# Patient Record
Sex: Male | Born: 1985 | Race: White | Hispanic: No | Smoking: Never smoker
Health system: Southern US, Community
[De-identification: ages and names within clinical notes are randomized; demographics above are authoritative.]

## PROBLEM LIST (undated history)

## (undated) DIAGNOSIS — T7840XA Allergy, unspecified, initial encounter: Secondary | ICD-10-CM

## (undated) DIAGNOSIS — R569 Unspecified convulsions: Secondary | ICD-10-CM

## (undated) HISTORY — DX: Allergy, unspecified, initial encounter: T78.40XA

## (undated) HISTORY — DX: Unspecified convulsions: R56.9

---

## 2011-08-23 DIAGNOSIS — G40909 Epilepsy, unspecified, not intractable, without status epilepticus: Secondary | ICD-10-CM | POA: Insufficient documentation

## 2012-04-19 DIAGNOSIS — G4733 Obstructive sleep apnea (adult) (pediatric): Secondary | ICD-10-CM | POA: Insufficient documentation

## 2012-04-19 DIAGNOSIS — G479 Sleep disorder, unspecified: Secondary | ICD-10-CM | POA: Insufficient documentation

## 2015-10-06 ENCOUNTER — Ambulatory Visit (INDEPENDENT_AMBULATORY_CARE_PROVIDER_SITE_OTHER): Payer: BLUE CROSS/BLUE SHIELD

## 2015-10-06 ENCOUNTER — Ambulatory Visit (INDEPENDENT_AMBULATORY_CARE_PROVIDER_SITE_OTHER): Payer: BLUE CROSS/BLUE SHIELD | Admitting: Family Medicine

## 2015-10-06 VITALS — BP 116/70 | HR 80 | Temp 98.1°F | Resp 18 | Ht 69.0 in | Wt 211.0 lb

## 2015-10-06 DIAGNOSIS — S62339A Displaced fracture of neck of unspecified metacarpal bone, initial encounter for closed fracture: Secondary | ICD-10-CM | POA: Diagnosis not present

## 2015-10-06 DIAGNOSIS — S6992XA Unspecified injury of left wrist, hand and finger(s), initial encounter: Secondary | ICD-10-CM

## 2015-10-06 NOTE — Progress Notes (Addendum)
Subjective:  By signing my name below, I, Stann Ore, attest that this documentation has been prepared under the direction and in the presence of Norberto Sorenson, MD. Electronically Signed: Stann Ore, Scribe. 10/06/2015 , 2:23 PM .  Patient was seen in Room 10 .   Patient ID: Lucas Anderson, male    DOB: 10/26/1985, 30 y.o.   MRN: [id number] Chief Complaint  Patient presents with  . Hand Injury    LEFT   HPI Lucas Anderson is a 30 y.o. male who presents to [facility] complaining of left hand pain due to an injury 2 evenings ago. 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 An opponent was dribbling the ball and he slid down to block with his hands. At the same time, a teammate defender slid forward to block and slammed his cleat into the patient's left hand. He was wearing goalie gloves at the time of injury. He has been icing the area, last ice applied yesterday around 12:00PM. He notes the swelling is about the same as yesterday night. He denies any prior injuries to the hand. He denies numbness or tingling pain.   He's right hand dominant.   Past Medical History  Diagnosis Date  . Allergy   . Seizures (HCC)    Prior to Admission medications   Medication Sig Start Date End Date Taking? Authorizing Provider  lamoTRIgine (LAMICTAL) 100 MG tablet Take 100 mg by mouth daily.   Yes Historical Provider, MD  levETIRAcetam (KEPPRA) 250 MG tablet Take 1,000 mg by mouth 2 (two) times daily.  IN THE MORNING AND  AT NIGHT   Yes Historical Provider, MD   Allergies  Allergen Reactions  . Codeine Nausea And Vomiting    Review of Systems  Constitutional: Negative for fever, chills and fatigue.  Musculoskeletal: Positive for myalgias, joint swelling and arthralgias. Negative for neck pain and neck stiffness.  Skin: Negative for rash and wound.  Neurological: Negative for dizziness, weakness, numbness and headaches.       Objective:   Physical Exam  Constitutional: He  is oriented to person, place, and time. He appears well-developed and well-nourished. No distress.  HENT:  Head: Normocephalic and atraumatic.  Eyes: EOM are normal. Pupils are equal, round, and reactive to light.  Neck: Neck supple.  Cardiovascular: Normal rate.   Pulses:      Radial pulses are 2+ on the right side, and 2+ on the left side.  Pulmonary/Chest: Effort normal. No respiratory distress.  Musculoskeletal: Normal range of motion.  Diffuse swelling over the dorsum of his left hand, bruising just proximal to the 4th and 5th MCP, good movement of IP joints and MCP joints, minimal tenderness to palpation along 5th metacarpal, pallor 3rd to 5th fingers, cool to touch, cap refill <1 second  Neurological: He is alert and oriented to person, place, and time.  Skin: Skin is warm and dry.  Psychiatric: He has a normal mood and affect. His behavior is normal.  Nursing note and vitals reviewed.   BP 116/70 mmHg  Pulse 80  Temp(Src) 98.1 F (36.7 C) (Oral)  Resp 18  Ht  (1.753 m)  Wt 211 lb (95.709 kg)  BMI 31.15 kg/m2  SpO2 99%   Dg Hand Complete Left  10/06/2015  CLINICAL DATA:  Injury to the hand from the cleavage while playing soccer since Sunday. EXAM: LEFT HAND - COMPLETE 3+ VIEW COMPARISON:  None. FINDINGS: There is displaced fracture of the distal fourth metacarpal. There  is no dislocation. IMPRESSION: Fracture fourth metacarpal. Electronically Signed   By: Sherian ReinWei-Chen  Lin M.D.   On: 10/06/2015 14:42      Assessment & Plan:  Placed in ulnar gutter splint, follow up 7-10 days for repeat xray to ensure has maintained alignment. Pt requests referral to Dr. Amanda PeaGramig to follow this - placed and copy of xray given to pt. Advised to ice and elevate.  RICE, placed in sling to help pt keep it elevated. otc nsaids/tylenol for pain. Will plan to keep in splint for 3-4 weeks, repeat xray at follow up visit and every 2 weeks - disc given to pt. Will take 4-6 weeks to heal. And should begin  ROM exercises, such as grip strengthening immediately after immobilization is complete. No contact sports for 6 weeks.   1. Hand injury, left, initial encounter   2. Fracture, metacarpal, neck, closed, initial encounter     Orders Placed This Encounter  Procedures  . DG Hand Complete Left    Standing Status: Future     Number of Occurrences: 1     Standing Expiration Date: 10/05/2016    Order Specific Question:  Reason for Exam (SYMPTOM  OR DIAGNOSIS REQUIRED)    Answer:  injury while soccer - cleat onto gloved hand    Order Specific Question:  Preferred imaging location?    Answer:  External    Meds ordered this encounter  Medications  . lamoTRIgine (LAMICTAL) 100 MG tablet    Sig: Take 100 mg by mouth daily.  Marland Kitchen. levETIRAcetam (KEPPRA) 250 MG tablet    Sig: Take 1,000 mg by mouth 2 (two) times daily. 1000MG  IN THE MORNING AND 1500MG  AT NIGHT    I personally performed the services described in this documentation, which was scribed in my presence. The recorded information has been reviewed and considered, and addended by me as needed.  Norberto SorensonEva Shaw, MD MPH

## 2015-10-06 NOTE — Progress Notes (Signed)
Ulnar gutter splint applied to the left hand/forearm per Dr. Alver FisherShaw's VO.  Charges dropped in Epic. Deliah BostonMichael Shada Nienaber, MS, PA-C 3:22 PM, 10/06/2015

## 2015-10-06 NOTE — Patient Instructions (Addendum)
IF you received an x-ray today, you will receive an invoice from [facility]. Please contact [facility] at [phone] with questions or concerns regarding your invoice.   IF you received labwork today, you will receive an invoice from [facility]. Please contact [facility] at [phone] with questions or concerns regarding your invoice.   Our billing staff will not be able to assist you with questions regarding bills from these companies.  You will be contacted with the lab results as soon as they are available. The fastest way to get your results is to activate your My Chart account. Instructions are located on the last page of this paperwork. If you have not heard from us regarding the results in 2 weeks, please contact this office.    Metacarpal Fracture A metacarpal fracture is a break (fracture) of a bone in the hand. Metacarpals are the bones that extend from your knuckles to your wrist. In each hand, you have five metacarpal bones that connect your fingers and your thumb to your wrist. Some hand fractures have bone pieces that are close together and stable (simple). These fractures may be treated with only a splint or cast. Hand fractures that have many pieces of broken bone (comminuted), unstable bone pieces (displaced), or a bone that breaks through the skin (compound) usually require surgery. CAUSES This injury may be caused by:  A fall.  A hard, direct hit to your hand.  An injury that squeezes your knuckle, stretches your finger out of place, or crushes your hand. RISK FACTORS This injury is more likely to occur if:  You play contact sports.  You have certain bone diseases. SYMPTOMS  Symptoms of this type of fracture develop soon after the injury. Symptoms may include:  Swelling.  Pain.  Stiffness.  Increased pain with movement.  Bruising.  Inability to move a finger.  A shortened finger.  A finger knuckle  that looks sunken in.  Unusual appearance of the hand or finger (deformity). DIAGNOSIS  This injury may be diagnosed based on your signs and symptoms, especially if you had a recent hand injury. Your health care provider will perform a physical exam. He or she may also order X-rays to confirm the diagnosis.  TREATMENT  Treatment for this injury depends on the type of fracture you have and how severe it is. Possible treatments include:  Non-reduction. This can be done if the bone does not need to be moved back into place. The fracture can be casted or splinted as it is.   Closed reduction. If your bone is stable and can be moved back into place, you may only need to wear a cast or splint or have buddy taping.  Closed reduction with internal fixation (CRIF). This is the most common treatment. You may have this procedure if your bone can be moved back into place but needs more support. Wires, pins, or screws may be inserted through your skin to stabilize the fracture.  Open reduction with internal fixation (ORIF). This may be needed if your fracture is severe and unstable. It involves surgery to move your bone back into the right position. Screws, wires, or plates are used to stabilize the fracture. After all procedures, you may need to wear a cast or a splint for several weeks. You will also need to have follow-up X-rays to make sure that the bone is healing well and staying in position. After you no longer need your cast or splint, you may  need physical therapy. This will help you to regain full movement and strength in your hand.  HOME CARE INSTRUCTIONS  If You Have a Cast:  Do not stick anything inside the cast to scratch your skin. Doing that increases your risk of infection.  Check the skin around the cast every day. Report any concerns to your health care provider. You may put lotion on dry skin around the edges of the cast. Do not apply lotion to the skin underneath the cast. If You Have  a Splint:  Wear it as directed by your health care provider. Remove it only as directed by your health care provider.  Loosen the splint if your fingers become numb and tingle, or if they turn cold and blue. Bathing  Cover the cast or splint with a watertight plastic bag to protect it from water while you take a bath or a shower. Do not let the cast or splint get wet. Managing Pain, Stiffness, and Swelling  If directed, apply ice to the injured area (if you have a splint, not a cast):  Put ice in a plastic bag.  Place a towel between your skin and the bag.  Leave the ice on for 20 minutes, 2-3 times a day.  Move your fingers often to avoid stiffness and to lessen swelling.  Raise the injured area above the level of your heart while you are sitting or lying down. Driving  Do not drive or operate heavy machinery while taking pain medicine.  Do not drive while wearing a cast or splint on a hand that you use for driving. Activity  Return to your normal activities as directed by your health care provider. Ask your health care provider what activities are safe for you. General Instructions  Do not put pressure on any part of the cast or splint until it is fully hardened. This may take several hours.  Keep the cast or splint clean and dry.  Do not use any tobacco products, including cigarettes, chewing tobacco, or electronic cigarettes. Tobacco can delay bone healing. If you need help quitting, ask your health care provider.  Take medicines only as directed by your health care provider.  Keep all follow-up visits as directed by your health care provider. This is important. SEEK MEDICAL CARE IF:   Your pain is getting worse.  You have redness, swelling, or pain in the injured area.   You have fluid, blood, or pus coming from under your cast or splint.   You notice a bad smell coming from under your cast or splint.   You have a fever.  SEEK IMMEDIATE MEDICAL CARE IF:    You develop a rash.   You have trouble breathing.   Your skin or nails on your injured hand turn blue or gray even after you loosen your splint.  Your injured hand feels cold or becomes numb even after you loosen your splint.   You develop severe pain under the cast or in your hand.   This information is not intended to replace advice given to you by your health care provider. Make sure you discuss any questions you have with your health care provider.   Document Released: 05/23/2005 Document Revised: 02/11/2015 Document Reviewed: 03/12/2014 Elsevier Interactive Patient Education [employer]

## 2016-11-23 DIAGNOSIS — J301 Allergic rhinitis due to pollen: Secondary | ICD-10-CM | POA: Insufficient documentation

## 2016-11-23 DIAGNOSIS — L219 Seborrheic dermatitis, unspecified: Secondary | ICD-10-CM | POA: Insufficient documentation

## 2017-08-13 IMAGING — CR DG HAND COMPLETE 3+V*L*
3 series · 3 of 3 positions shown · non-contrast
Comparison: None.

CLINICAL DATA: Injury to the hand from the cleavage while playing
soccer since [REDACTED].

EXAM:
LEFT HAND - COMPLETE 3+ VIEW

[PA]
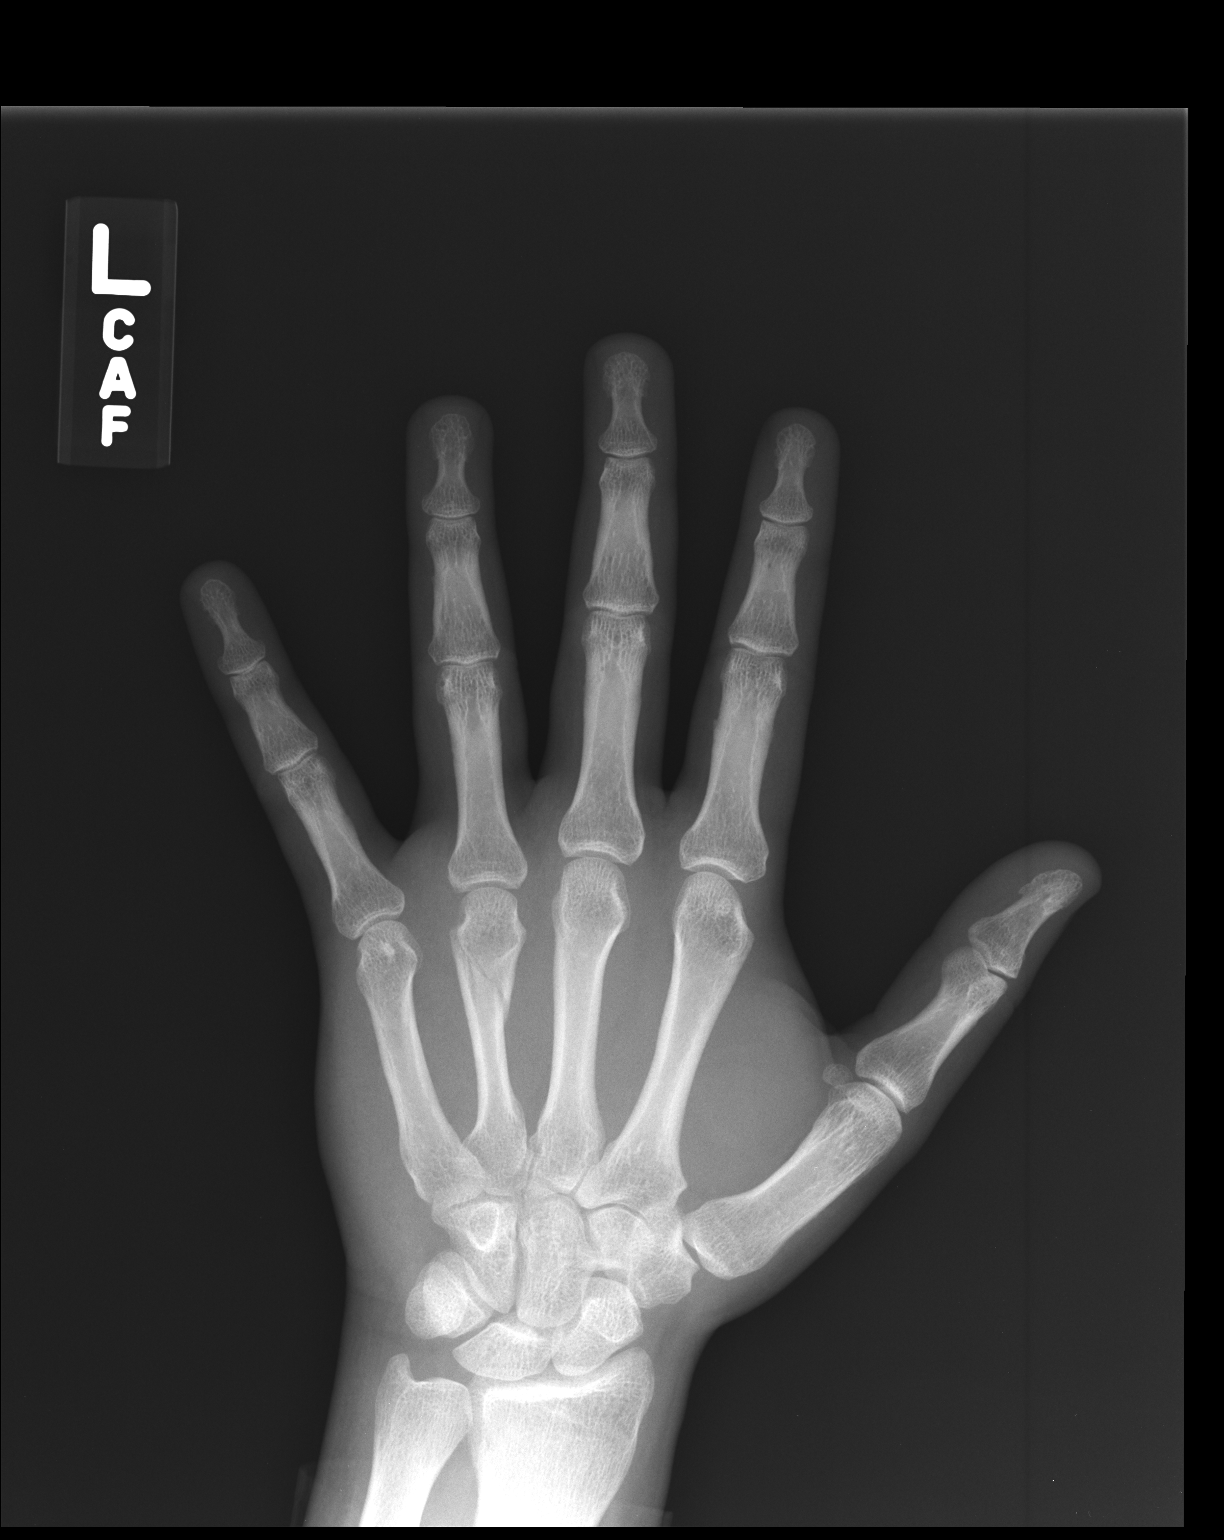

[lateral]
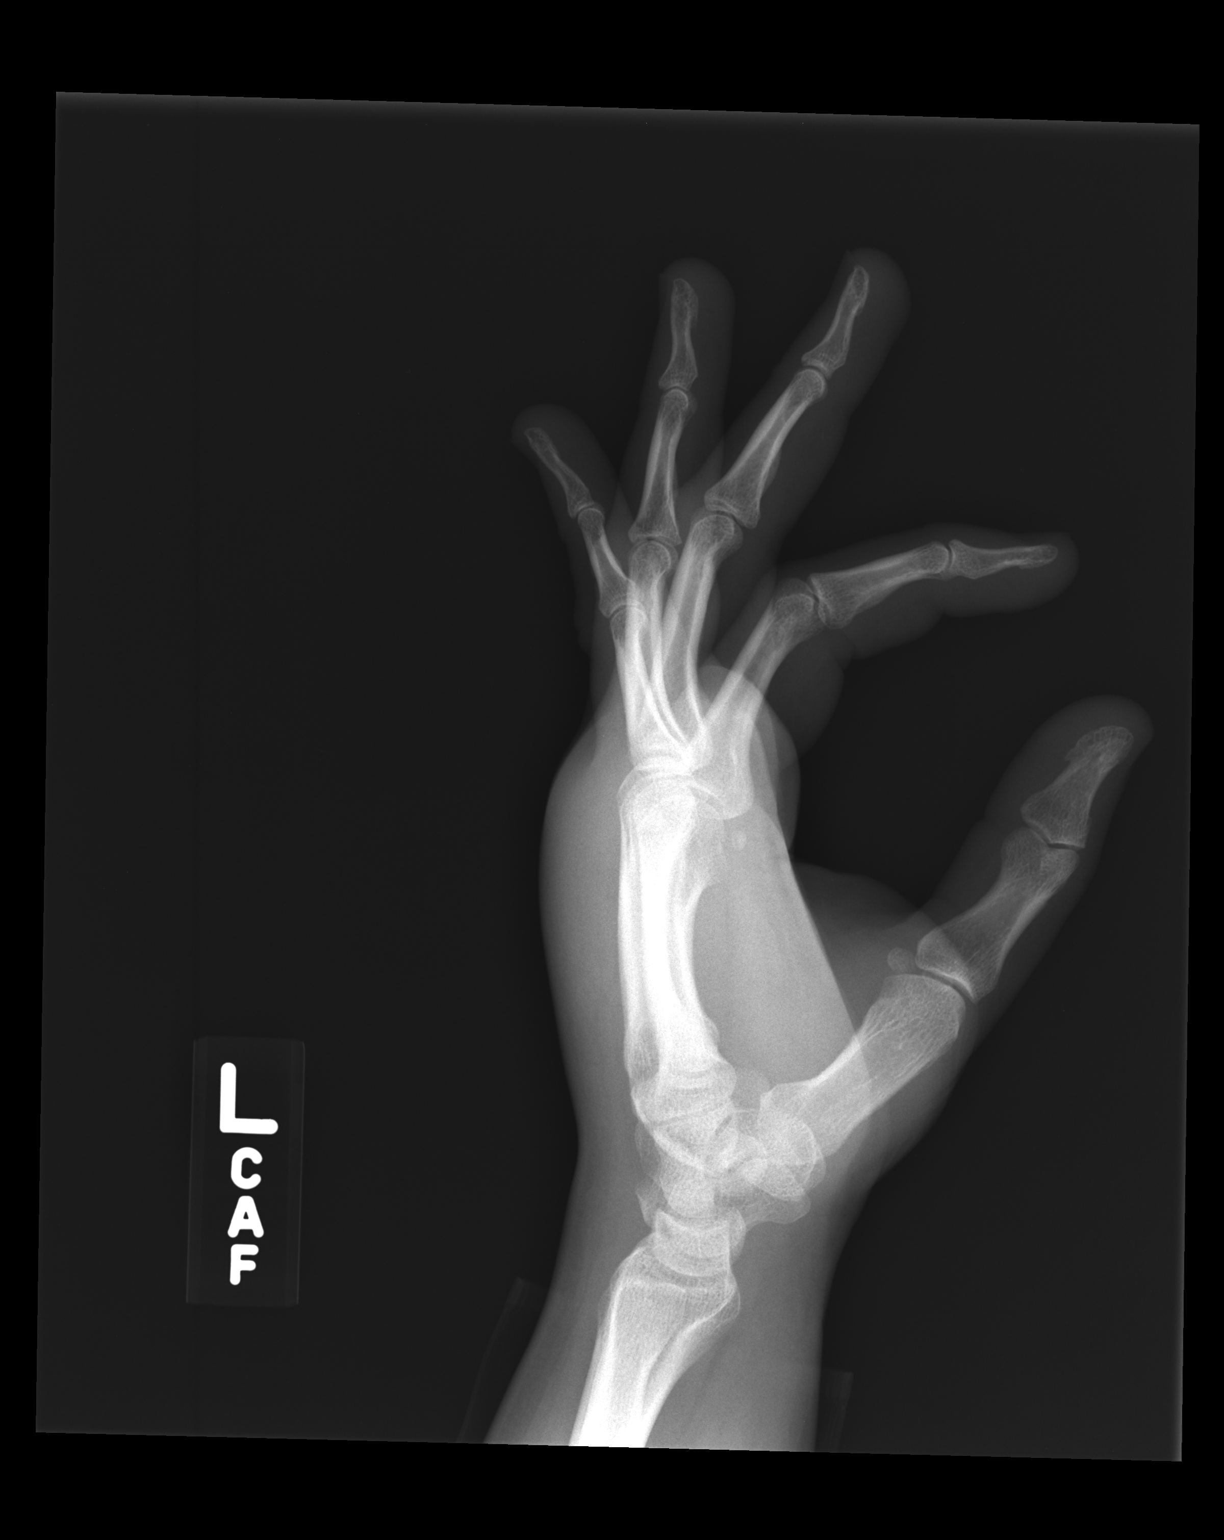

[pa obl]
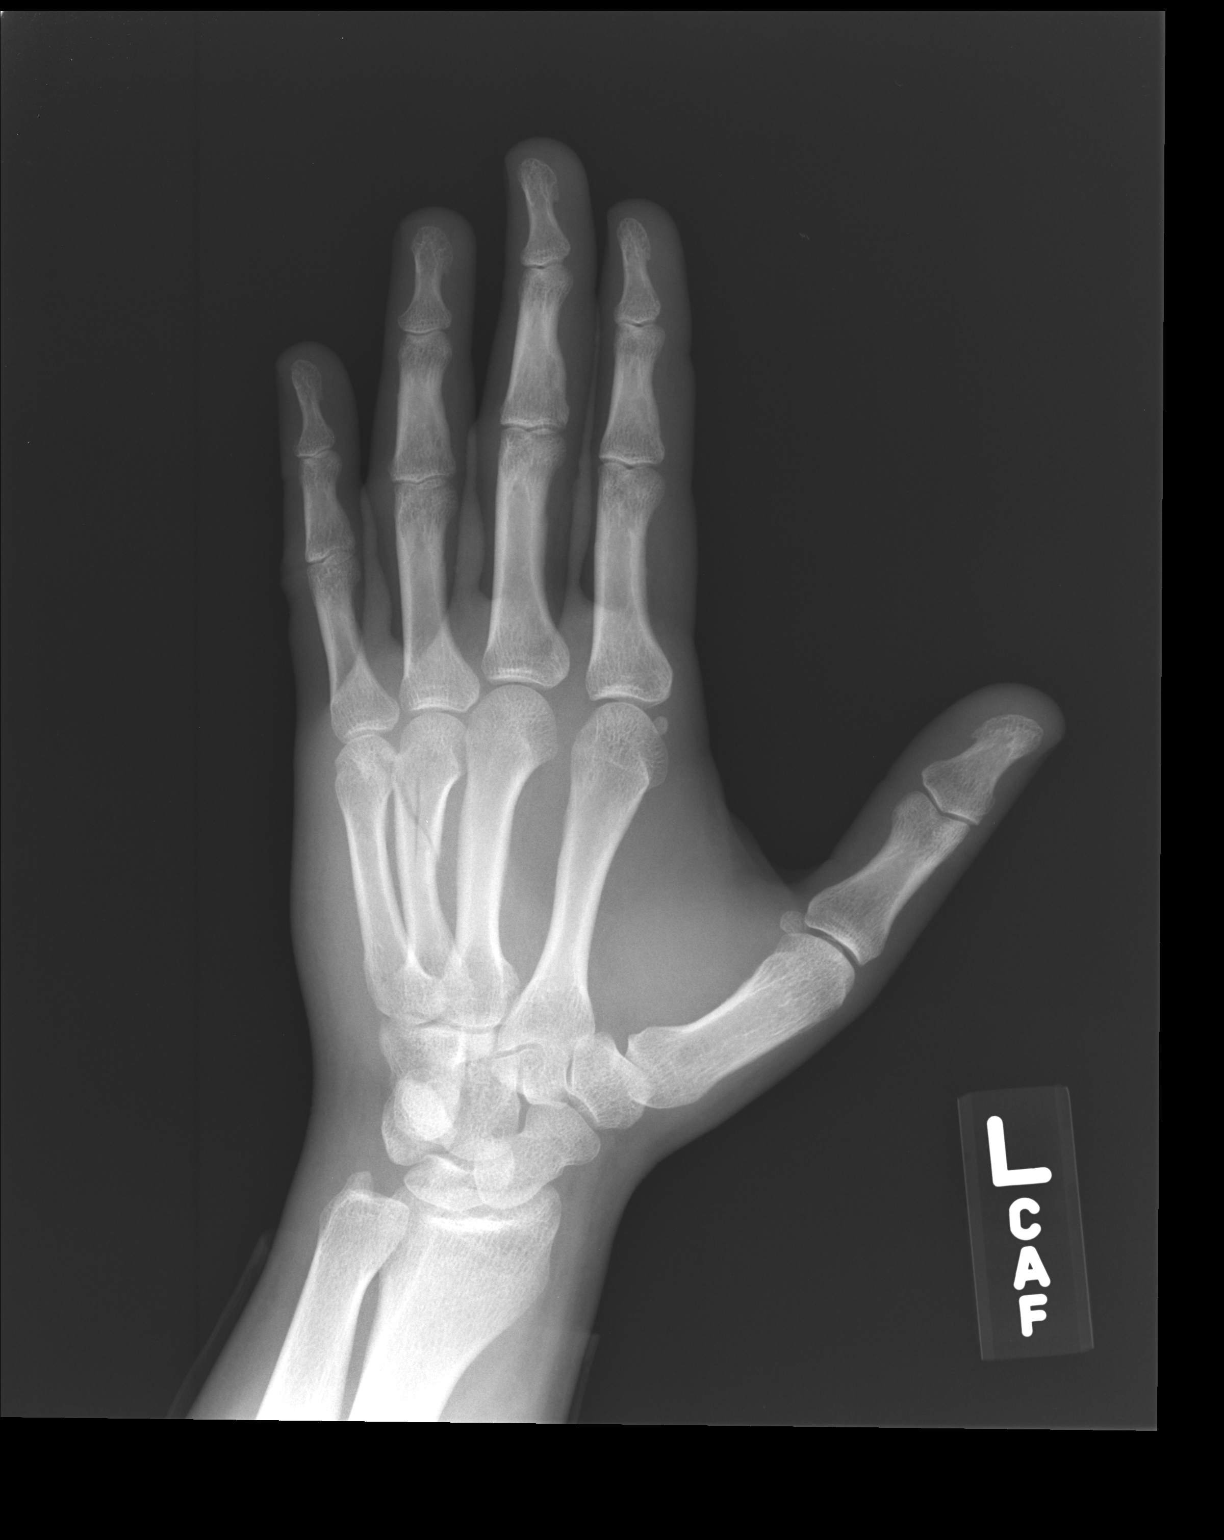

[3 of 3 positions shown; findings below may reference images not displayed]

FINDINGS: There is displaced fracture of the distal fourth metacarpal. There
is no dislocation.
IMPRESSION: Fracture fourth metacarpal.

## 2017-10-05 ENCOUNTER — Encounter: Payer: Self-pay | Admitting: Neurology

## 2017-12-19 ENCOUNTER — Other Ambulatory Visit: Payer: Self-pay

## 2017-12-27 ENCOUNTER — Other Ambulatory Visit: Payer: Self-pay

## 2017-12-27 ENCOUNTER — Encounter: Payer: Self-pay | Admitting: Neurology

## 2017-12-27 ENCOUNTER — Ambulatory Visit: Payer: BC Managed Care – PPO | Admitting: Neurology

## 2017-12-27 ENCOUNTER — Other Ambulatory Visit (INDEPENDENT_AMBULATORY_CARE_PROVIDER_SITE_OTHER): Payer: BC Managed Care – PPO

## 2017-12-27 VITALS — BP 110/72 | HR 68 | Wt 213.0 lb

## 2017-12-27 DIAGNOSIS — G40319 Generalized idiopathic epilepsy and epileptic syndromes, intractable, without status epilepticus: Secondary | ICD-10-CM

## 2017-12-27 LAB — COMPREHENSIVE METABOLIC PANEL
ALT: 28 U/L (ref 0–53)
AST: 19 U/L (ref 0–37)
Albumin: 4.6 g/dL (ref 3.5–5.2)
Alkaline Phosphatase: 75 U/L (ref 39–117)
BUN: 19 mg/dL (ref 6–23)
CALCIUM: 9.6 mg/dL (ref 8.4–10.5)
CHLORIDE: 101 meq/L (ref 96–112)
CO2: 32 mEq/L (ref 19–32)
Creatinine, Ser: 0.99 mg/dL (ref 0.40–1.50)
GFR: 93.22 mL/min (ref >60.00)
Glucose, Bld: 91 mg/dL (ref 70–99)
POTASSIUM: 4.3 meq/L (ref 3.5–5.1)
SODIUM: 139 meq/L (ref 135–145)
Total Bilirubin: 0.5 mg/dL (ref 0.2–1.2)
Total Protein: 7.8 g/dL (ref 6.0–8.3)

## 2017-12-27 LAB — CBC
HEMATOCRIT: 47.1 % (ref 39.0–52.0)
Hemoglobin: 15.7 g/dL (ref 13.0–17.0)
MCHC: 33.4 g/dL (ref 30.0–36.0)
MCV: 89.3 fl (ref 78.0–100.0)
PLATELETS: 207 10*3/uL (ref 150.0–400.0)
RBC: 5.28 Mil/uL (ref 4.22–5.81)
RDW: 12.7 % (ref 11.5–15.5)
WBC: 6.5 10*3/uL (ref 4.0–10.5)

## 2017-12-27 MED ORDER — LAMOTRIGINE 200 MG PO TABS: ORAL_TABLET | ORAL | 11 refills | Status: DC

## 2017-12-27 MED ORDER — LEVETIRACETAM 500 MG PO TABS: ORAL_TABLET | ORAL | 11 refills | Status: DC

## 2017-12-27 MED ORDER — DIVALPROEX SODIUM ER 250 MG PO TB24: ORAL_TABLET | ORAL | 11 refills | Status: DC

## 2017-12-27 NOTE — Patient Instructions (Signed)
1. Bloodwork for CBC, CMP 2. Start Depakote ER 250mg  every night 3. Continue Keppra 500mg : take 3 tabs twice a day, 4. Switch to Lamictal 200mg : take 2 tablets twice a day (same daily dose of 400mg  twice a day) 5. Keep a calendar of your seizures, follow-up in 6 months, call for any changes  Seizure Precautions: 1. If medication has been prescribed for you to prevent seizures, take it exactly as directed.  Do not stop taking the medicine without talking to your doctor first, even if you have not had a seizure in a long time.   2. Avoid activities in which a seizure would cause danger to yourself or to others.  Don't operate dangerous machinery, swim alone, or climb in high or dangerous places, such as on ladders, roofs, or girders.  Do not drive unless your doctor says you may.  3. If you have any warning that you may have a seizure, lay down in a safe place where you can't hurt yourself.    4.  No driving for 6 months from last seizure, as per [facility].   Please refer to the following link on the Epilepsy Foundation of America's website for more information: http://www.epilepsyfoundation.org/answerplace/Social/driving/drivingu.cfm   5.  Maintain good sleep hygiene. Avoid alcohol.  6.  Contact your doctor if you have any problems that may be related to the medicine you are taking.  7.  Call 911 and bring the patient back to the ED if:        A.  The seizure lasts longer than 5 minutes.       B.  The patient doesn't awaken shortly after the seizure  C.  The patient has new problems such as difficulty seeing, speaking or moving  D.  The patient was injured during the seizure  E.  The patient has a temperature over 102 F (39C)  F.  The patient vomited and now is having trouble breathing

## 2017-12-27 NOTE — Progress Notes (Signed)
NEUROLOGY CONSULTATION NOTE  Lucas Anderson MRN: [id number] DOB: January 12, 1986  Referring provider: Jose Persia, PA-C Primary care provider: none listed  Reason for consult:  Establish care for seizures  Thank you for your kind referral of Lucas Anderson for consultation of the above symptoms. Although his history is well known to you, please allow me to reiterate it for the purpose of our medical record. He is alone in the office today. Records and images were personally reviewed where available.  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 Seizures started in 2009, he has an average of 3-4 seizures a year, provoked by sleep deprivation and missed medication. Longest seizure-free interval was close to a year. He denies any clear warning, but one time at work last March 2019 he felt fuzzy then woke up on the floor with slight tongue bite. Family has reported he would stare and become unresponsive, then vocalize and fall to the ground, with convulsions lasting a few minutes. MRI brain in [place] reported as normal. EEG in January 2012 reported bilaterally synchronous bifrontally predominant spike and waves and polyspikes of 3-4 Hz frequency. He had irritability on higher doses of Keppra, currently on Keppra 1500mg  BID. He has been on escalating doses of Lamotrigine, last dose change in September 2018 to 400mg  BID for seizure in August 2018. He reports an increase in frequency of seizures, he had 6 seizures in 2018, then reports a seizure at work in January 2019 and March or April 2019. He missed his medication in January but there was no clear trigger for last seizure in March/April 2019. He denies any clear myoclonic jerks, he notices occasional trembling of 2 fingers on his right hand during the day. He denies any gaps in time, olfactory/gustatory hallucinations, deja vu, rising epigastric sensation,  focal numbness/tingling/weakness. He usually has a headache lasting 5-6 hours after a seizure and sleeps. He denies any significant regular headaches, dizziness, diplopia, dysarthria/dysphagia, neck pain, bowel/bladder dysfunction. He has occasional low back pain. Memory is good, he has noticed memory changes with his seizure medications, he usually writes things down. He lives with his 49 year old son. He does not drive.   Epilepsy Risk Factors:  His 2 paternal uncles had seizures. Otherwise he had a normal birth and early development.  There is no history of febrile convulsions, CNS infections such as meningitis/encephalitis, significant traumatic brain injury, neurosurgical procedures.   PAST MEDICAL HISTORY: Past Medical History:  Diagnosis Date  . Allergy   . Seizures (HCC)     PAST SURGICAL HISTORY: History reviewed. No pertinent surgical history.  MEDICATIONS: Current Outpatient Medications on File Prior to Visit  Medication Sig Dispense Refill  . lamoTRIgine (LAMICTAL) 100 MG tablet Take 100 mg by mouth daily (taking 400mg  BID)    . levETIRAcetam (KEPPRA) 500 MG tablet Take 3 tabs BID    . Multiple Vitamin (MULTIVITAMIN) tablet Take by mouth.     No current facility-administered medications on file prior to visit.     ALLERGIES: Allergies  Allergen Reactions  . Codeine Nausea And Vomiting  . Chlorpheniramine-Pseudoeph   . Diphenhydramine   . Doxylamine     FAMILY HISTORY: Family History  Problem Relation Age of Onset  . Cancer Mother   . Hypertension Mother   . Cancer Maternal Grandmother   . Hyperlipidemia Maternal Grandmother     SOCIAL HISTORY: Social History   Socioeconomic History  .  Marital status: Divorced    Spouse name: Not on file  . Number of children: Not on file  . Years of education: Not on file  . Highest education level: Not on file  Occupational History  . Not on file  Social Needs  . Financial resource strain: Not on file  . Food  insecurity:    Worry: Not on file    Inability: Not on file  . Transportation needs:    Medical: Not on file    Non-medical: Not on file  Tobacco Use  . Smoking status: Never Smoker  . Smokeless tobacco: Never Used  Substance and Sexual Activity  . Alcohol use: Not on file  . Drug use: Never  . Sexual activity: Not on file  Lifestyle  . Physical activity:    Days per week: Not on file    Minutes per session: Not on file  . Stress: Not on file  Relationships  . Social connections:    Talks on phone: Not on file    Gets together: Not on file    Attends religious service: Not on file    Active member of club or organization: Not on file    Attends meetings of clubs or organizations: Not on file    Relationship status: Not on file  . Intimate partner violence:    Fear of current or ex partner: Not on file    Emotionally abused: Not on file    Physically abused: Not on file    Forced sexual activity: Not on file  Other Topics Concern  . Not on file  Social History Narrative  . Not on file    REVIEW OF SYSTEMS: Constitutional: No fevers, chills, or sweats, no generalized fatigue, change in appetite Eyes: No visual changes, double vision, eye pain Ear, nose and throat: No hearing loss, ear pain, nasal congestion, sore throat Cardiovascular: No chest pain, palpitations Respiratory:  No shortness of breath at rest or with exertion, wheezes GastrointestinaI: No nausea, vomiting, diarrhea, abdominal pain, fecal incontinence Genitourinary:  No dysuria, urinary retention or frequency Musculoskeletal:  No neck pain,+ occl back pain Integumentary: No rash, pruritus, skin lesions Neurological: as above Psychiatric: No depression, insomnia, anxiety Endocrine: No palpitations, fatigue, diaphoresis, mood swings, change in appetite, change in weight, increased thirst Hematologic/Lymphatic:  No anemia, purpura, petechiae. Allergic/Immunologic: no itchy/runny eyes, nasal congestion,  recent allergic reactions, rashes  PHYSICAL EXAM: Vitals:   12/27/17 1405  BP: 110/72  Pulse: 68  SpO2: 98%   General: No acute distress Head:  Normocephalic/atraumatic Eyes: Fundoscopic exam shows bilateral sharp discs, no vessel changes, exudates, or hemorrhages Neck: supple, no paraspinal tenderness, full range of motion Back: No paraspinal tenderness Heart: regular rate and rhythm Lungs: Clear to auscultation bilaterally. Vascular: No carotid bruits. Skin/Extremities: No rash, no edema Neurological Exam: Mental status: alert and oriented to person, place, and time, no dysarthria or aphasia, Fund of knowledge is appropriate.  Recent and remote memory are intact. 3/3 delayed recall.  Attention and concentration are normal.    Able to name objects and repeat phrases. Cranial nerves: CN I: not tested CN II: pupils equal, round and reactive to light, visual fields intact, fundi unremarkable. CN III, IV, VI:  full range of motion, no nystagmus, no ptosis CN V: facial sensation intact CN VII: upper and lower face symmetric CN VIII: hearing intact to finger rub CN IX, X: gag intact, uvula midline CN XI: sternocleidomastoid and trapezius muscles intact CN XII: tongue midline Bulk &  Tone: normal, no fasciculations. Motor: 5/5 throughout with no pronator drift. Sensation: intact to light touch, cold, pin, vibration and joint position sense.  No extinction to double simultaneous stimulation.  Romberg test negative Deep Tendon Reflexes: +2 throughout, no ankle clonus Plantar responses: downgoing bilaterally Cerebellar: no incoordination on finger to nose, heel to shin. No dysdiadochokinesia Gait: narrow-based and steady, able to tandem walk adequately. Tremor: none  IMPRESSION: This is a pleasant 32 year old right-handed man with a history of primary generalized epilepsy presenting to establish care. Prior EEG showed generalized 3-4 Hz spike and polyspikes with frontal predominance. He  continues to have recurrent seizures on Keppra 1500mg  BID and Lamotrigine 400mg  BID. Increasing doses will likely cause side effects and may not provide additional seizure control. We discussed adding on low dose Depakote ER 250mg  qhs. Side effects discussed. CBC and CMP will be ordered for safety labs before adding on Depakote. [place] driving laws were discussed with the patient, and he knows to stop driving after a seizure, until 6 months seizure-free. He was advised to keep a calendar of his seizures and follow-up in 6 months. He knows to call for any changes.   Thank you for allowing me to participate in the care of this patient. Please do not hesitate to call for any questions or concerns.   Patrcia DollyKaren Aquino, M.D.  CC: Jose PersiaKelly Conner, PA-C

## 2018-04-27 ENCOUNTER — Telehealth: Payer: Self-pay | Admitting: Neurology

## 2018-04-27 NOTE — Telephone Encounter (Signed)
Patient lmom  and has moved and needs to transfer his Prescriptions to [facility] [address] [place]. Thanks [phone].

## 2018-04-27 NOTE — Telephone Encounter (Signed)
This has been pt's preferred pharmacy for some time.  Last Rx's were sent here.  No changes made.

## 2018-06-07 ENCOUNTER — Encounter: Payer: Self-pay | Admitting: Neurology

## 2018-06-07 ENCOUNTER — Other Ambulatory Visit: Payer: Self-pay

## 2018-06-07 ENCOUNTER — Ambulatory Visit: Payer: BC Managed Care – PPO | Admitting: Neurology

## 2018-06-07 VITALS — BP 112/68 | HR 76 | Ht 70.0 in | Wt 218.0 lb

## 2018-06-07 DIAGNOSIS — G40319 Generalized idiopathic epilepsy and epileptic syndromes, intractable, without status epilepticus: Secondary | ICD-10-CM

## 2018-06-07 MED ORDER — LEVETIRACETAM 500 MG PO TABS: ORAL_TABLET | ORAL | 3 refills | Status: DC

## 2018-06-07 MED ORDER — DIVALPROEX SODIUM ER 250 MG PO TB24: ORAL_TABLET | ORAL | 3 refills | Status: DC

## 2018-06-07 MED ORDER — LAMOTRIGINE 200 MG PO TABS: ORAL_TABLET | ORAL | 3 refills | Status: DC

## 2018-06-07 NOTE — Patient Instructions (Addendum)
1. Bloodwork for Lamictal, Keppra, and Depakote levels  Your provider has requested that you have LABS drawn in the FUTURE.  Our laboratory is located within [facility], [address] [phone] of this building.  You do not need an appointment however, please come to [facility] prior to going to the lab - we will check you into the lab.   Please be sure to have labwork done PRIOR to taking your medications.    2. Continue all your current medications  3. Follow-up in 6 months, call for any changes  Seizure Precautions: 1. If medication has been prescribed for you to prevent seizures, take it exactly as directed.  Do not stop taking the medicine without talking to your doctor first, even if you have not had a seizure in a long time.   2. Avoid activities in which a seizure would cause danger to yourself or to others.  Don't operate dangerous machinery, swim alone, or climb in high or dangerous places, such as on ladders, roofs, or girders.  Do not drive unless your doctor says you may.  3. If you have any warning that you may have a seizure, lay down in a safe place where you can't hurt yourself.    4.  No driving for 6 months from last seizure, as per [facility].   Please refer to the following link on the Epile[employer] Foundation of [employer]'s website for more information: http://www.epilepsyfoundation.org/answerplace/Social/driving/drivingu.cfm   5.  Maintain good sleep hygiene. Avoid alcohol.  6.  Contact your doctor if you have any problems that may be related to the medicine you are taking.  7.  Call 911 and bring the patient back to the ED if:        A.  The seizure lasts longer than 5 minutes.       B.  The patient doesn't awaken shortly after the seizure  C.  The patient has new problems such as difficulty seeing, speaking or moving  D.  The patient was injured during the seizure  E.  The patient has a temperature over 102 F (39C)  F.  The patient vomited and now is  having trouble breathing

## 2018-06-07 NOTE — Progress Notes (Signed)
NEUROLOGY FOLLOW UP OFFICE NOTE  Bernette RedbirdKevin Goswick [id number] [birth year]  HISTORY OF PRESENT ILLNESS: I had the pleasure of seeing Bernette RedbirdKevin Wojcicki in follow-up in the neurology clinic on 06/07/2018.  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 He usually has an average of 3-4 seizures a year, however over the past 2 years has had an increase in seizure frequency on Keppra 1500mg  BID and Lamotrigine 400mg  BID. We discussed adding on low dose Depakote ER 250mg  qhs on his last visit. He reports only one seizure in 5 months which occurred last August, he had missed his morning doses and had a seizure at work around 10:30am. 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 He has noticed a few changes since adding on Depakote, he has occasional right hand tremors which he notices when holding something, it has not affected writing. He has noticed that he has to think more on how to spell a word, it usually comes to him, but if in a rush he has to use an [employer] dictionary. This has not affected his work. He has noticed more drowsiness if he is not stimulated, he can sleep up to 10 hours on a weekend or if in a quiet room. He was initially dizzy, but this has improved, he has not experienced it since mid-December. He denies any staring/unresponsive episodes, olfactory/gustatory hallucinations, focal numbness/tingling/weakness, myoclonic jerks. No headaches, diplopia, gait instability or falls. Mood is good.   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 Seizures started in 2009, he has an average of 3-4 seizures a year, provoked by sleep deprivation and missed medication. Longest seizure-free interval was close to a year. He denies any clear warning, but one time at work last March 2019 he felt fuzzy then woke up on the floor with slight tongue bite. Family has reported he would stare and become unresponsive, then vocalize and fall to the ground, with convulsions lasting a few minutes. [place]. EEG in January 2012 reported bilaterally synchronous bifrontally predominant spike and waves and polyspikes of 3-4 Hz frequency. He had irritability on higher doses of Keppra, currently on Keppra 1500mg  BID. He has been on escalating doses of Lamotrigine, last dose change in September 2018 to 400mg  BID for seizure in August 2018. He reports an increase in frequency of seizures, he had 6 seizures in 2018, then reports a seizure at work in January 2019 and March or April 2019. He missed his medication in January but there was no clear trigger for last seizure in March/April 2019. He denies any clear myoclonic jerks, he notices occasional trembling of 2 fingers on  his right hand during the day. He denies any gaps in time, olfactory/gustatory hallucinations, deja vu, rising epigastric sensation, focal numbness/tingling/weakness. He usually has a headache lasting 5-6 hours after a seizure and sleeps. He denies any significant regular headaches, dizziness, diplopia, dysarthria/dysphagia, neck pain, bowel/bladder dysfunction. He has occasional low back pain. Memory is good, he has noticed memory changes with his seizure medications, he usually writes things down. He lives with his 41 year old son. He does not  drive.   Epilepsy Risk Factors:  His 2 paternal uncles had seizures. Otherwise he had a normal birth and early development.  There is no history of febrile convulsions, CNS infections such as meningitis/encephalitis, significant traumatic brain injury, neurosurgical procedures.  PAST MEDICAL HISTORY: Past Medical History:  Diagnosis Date  . Allergy   . Seizures (HCC)     MEDICATIONS: Current Outpatient Medications on File Prior to Visit  Medication Sig Dispense Refill  . divalproex (DEPAKOTE ER) 250 MG 24 hr tablet Take 1 tablet every night 30 tablet 11  . lamoTRIgine (LAMICTAL) 200 MG tablet Take 2 tablets twice a day 120 tablet 11  . levETIRAcetam (KEPPRA) 500 MG tablet Take 3 tablets twice a day 180 tablet 11  . Multiple Vitamin (MULTIVITAMIN) tablet Take by mouth.     No current facility-administered medications on file prior to visit.     ALLERGIES: Allergies  Allergen Reactions  . Codeine Nausea And Vomiting  . Chlorpheniramine-Pseudoeph   . Diphenhydramine   . Doxylamine     FAMILY HISTORY: Family History  Problem Relation Age of Onset  . Cancer Mother   . Hypertension Mother   . Cancer Maternal Grandmother   . Hyperlipidemia Maternal Grandmother     SOCIAL HISTORY: Social History   Socioeconomic History  . Marital status: Divorced    Spouse name: Not on file  . Number of children: Not on file  . Years of education: Not on file  . Highest education level: Not on file  Occupational History  . Not on file  Social Needs  . Financial resource strain: Not on file  . Food insecurity:    Worry: Not on file    Inability: Not on file  . Transportation needs:    Medical: Not on file    Non-medical: Not on file  Tobacco Use  . Smoking status: Never Smoker  . Smokeless tobacco: Never Used  Substance and Sexual Activity  . Alcohol use: Not on file  . Drug use: Never  . Sexual activity: Not on file  Lifestyle  . Physical activity:    Days per week: Not  on file    Minutes per session: Not on file  . Stress: Not on file  Relationships  . Social connections:    Talks on phone: Not on file    Gets together: Not on file    Attends religious service: Not on file    Active member of club or organization: Not on file    Attends meetings of clubs or organizations: Not on file    Relationship status: Not on file  . Intimate partner violence:    Fear of current or ex partner: Not on file    Emotionally abused: Not on file    Physically abused: Not on file    Forced sexual activity: Not on file  Other Topics Concern  . Not on file  Social History Narrative  . Not on file    REVIEW OF SYSTEMS: Constitutional: No fevers, chills,  or sweats, no generalized fatigue, change in appetite Eyes: No visual changes, double vision, eye pain Ear, nose and throat: No hearing loss, ear pain, nasal congestion, sore throat Cardiovascular: No chest pain, palpitations Respiratory:  No shortness of breath at rest or with exertion, wheezes GastrointestinaI: No nausea, vomiting, diarrhea, abdominal pain, fecal incontinence Genitourinary:  No dysuria, urinary retention or frequency Musculoskeletal:  No neck pain, back pain Integumentary: No rash, pruritus, skin lesions Neurological: as above Psychiatric: No depression, insomnia, anxiety Endocrine: No palpitations, fatigue, diaphoresis, mood swings, change in appetite, change in weight, increased thirst Hematologic/Lymphatic:  No anemia, purpura, petechiae. Allergic/Immunologic: no itchy/runny eyes, nasal congestion, recent allergic reactions, rashes  PHYSICAL EXAM: Vitals:   06/07/18 0957  BP: 112/68  Pulse: 76  SpO2: 97%   General: No acute distress Head:  Normocephalic/atraumatic Neck: supple, no paraspinal tenderness, full range of motion Heart:  Regular rate and rhythm Lungs:  Clear to auscultation bilaterally Back: No paraspinal tenderness Skin/Extremities: No rash, no edema Neurological Exam:  alert and oriented to person, place, and time. No aphasia or dysarthria. Fund of knowledge is appropriate.  Recent and remote memory are intact.  Attention and concentration are normal.    Able to name objects and repeat phrases. Cranial nerves: Pupils equal, round, reactive to light. Extraocular movements intact with no nystagmus. Visual fields full. Facial sensation intact. No facial asymmetry. Tongue, uvula, palate midline.  Motor: Bulk and tone normal, muscle strength 5/5 throughout with no pronator drift.  Sensation to light touch, temperature and vibration intact.  No extinction to double simultaneous stimulation.  Finger to nose testing intact.  Gait narrow-based and steady, able to tandem walk adequately.  Romberg negative. No resting or postural tremor, minimal endpoint tremor seen L>R  IMPRESSION: This is a pleasant 33 yo RH man with a history of primary generalized epilepsy. Prior EEG showed generalized 3-4 Hz spike and polyspikes with frontal predominance. Seizures have decreased with addition of low dose Depakote ER 250mg  qhs to Keppra 1500mg  BID and Lamotrigine 400mg  BID. He has had only 1 seizure last August 2019 in the setting of missing AM doses. He has mild side effects with addition of Depakote, we agreed to continue monitor symptoms for now and continue current medications. Check Depakote, Lamictal, and Keppra levels. He does not drive. Follow-up in 6 months. He knows to call for any changes.  Thank you for allowing me to participate in his care.  Please do not hesitate to call for any questions or concerns.  The duration of this appointment visit was 27 minutes of face-to-face time with the patient.  Greater than 50% of this time was spent in counseling, explanation of diagnosis, planning of further management, and coordination of care.   Patrcia Dolly, M.D.

## 2018-12-31 ENCOUNTER — Ambulatory Visit: Payer: BC Managed Care – PPO | Admitting: Neurology

## 2019-01-09 ENCOUNTER — Telehealth: Payer: Self-pay

## 2019-01-09 NOTE — Telephone Encounter (Signed)
Pt believes that he woke up during the night and took a second dose of all medications.  Normally takes meds at 6:30pm and pt believes he took them again sometime during the night. He cannot recall the time.  Woke up with HA and feeling dizzy.  No alcohol consumption. Pt has been on vacation since last Saturday.  No fevers or signs of infection per pt.  Sleeping fine every night. Woke up a number of times last night for the first time.  Has not taken anything for dizziness or HA today.  Fluid intake- plenty of water intake per pt. Pt is in [place] with family.

## 2019-01-09 NOTE — Telephone Encounter (Signed)
The additional dose may make him feel off for the next day or so, but should quiet down. Continue taking his current medications as prescribed, would start using a pillbox so that he can monitor intake. Thanks

## 2019-01-10 NOTE — Telephone Encounter (Signed)
Left message informing pt of Dr. Amparo Bristol recommendations. Instructed to call back if still not feeling well or had other concerns. Also instructed to stay hydrated, avoid alcohol, get good sleep.

## 2019-02-01 ENCOUNTER — Encounter: Payer: Self-pay | Admitting: Neurology

## 2019-02-01 ENCOUNTER — Other Ambulatory Visit: Payer: Self-pay

## 2019-02-01 ENCOUNTER — Ambulatory Visit: Payer: BC Managed Care – PPO | Admitting: Neurology

## 2019-02-01 VITALS — BP 127/81 | HR 79 | Ht 69.0 in | Wt 227.5 lb

## 2019-02-01 DIAGNOSIS — R400 Somnolence: Secondary | ICD-10-CM | POA: Diagnosis not present

## 2019-02-01 DIAGNOSIS — G40319 Generalized idiopathic epilepsy and epileptic syndromes, intractable, without status epilepticus: Secondary | ICD-10-CM

## 2019-02-01 MED ORDER — DIVALPROEX SODIUM ER 250 MG PO TB24: ORAL_TABLET | ORAL | 3 refills | Status: DC

## 2019-02-01 MED ORDER — LEVETIRACETAM 500 MG PO TABS: ORAL_TABLET | ORAL | 3 refills | Status: DC

## 2019-02-01 MED ORDER — LAMOTRIGINE 200 MG PO TABS: ORAL_TABLET | ORAL | 3 refills | Status: DC

## 2019-02-01 NOTE — Progress Notes (Signed)
NEUROLOGY FOLLOW UP OFFICE NOTE  Lucas Anderson [id number] 29-Jun-1985  HISTORY OF PRESENT ILLNESS: I had the pleasure of seeing Lucas Anderson in follow-up in the neurology clinic on 02/01/2019.  The patient was last seen 7 months ago for primary generalized epilepsy. He usually has an average of 3-4 seizures a year, however presented due to an increase in seizures frequency since 2017 on high doses of Lamotrigine 400mg  BID and Levetiracetam 1500mg  BID. He has had an excellent response to addition of low dose Depakote ER 250mg  qhs, no seizures since August 2019 when he missed morning doses of his medications. He was reporting right hand tremors, he feels this is a little better. He denies any side effects on his medications. He feels his memory is about the same, work is not affected. Mood is stable. He continues to report drowsiness in the afternoon, he usually gets 6-7 hours of sleep. If he does not wake up in the middle of the night, he feels refreshed. He has been told he snores. A home sleep study attempt was done 2-3 years ago but the device he got was not working and test was not done. He denies any headaches, dizziness, diplopia, focal numbness/tingling/weakness. He denies any staring/unresponsive episodes, gaps in time, olfactory/gustatory hallucinations, myoclonic jerks. No falls.   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 Seizures started in 2009, he has an average of 3-4 seizures a year, provoked by sleep deprivation and missed medication. Longest seizure-free interval was close to a year. He denies any clear warning, but one time at work last March 2019 he felt fuzzy then woke up on the floor with slight tongue bite. Family has reported he would stare and become unresponsive, then vocalize and fall to the ground, with convulsions lasting a few minutes. MRI brain in [place]  reported as normal. EEG in January 2012 reported bilaterally synchronous bifrontally predominant spike and waves and polyspikes of 3-4 Hz frequency. He had irritability on higher doses of Keppra, currently on Keppra 1500mg  BID. He has been on escalating doses of Lamotrigine, last dose change in September 2018 to 400mg  BID for seizure in August 2018. He reports an increase in frequency of seizures, he had 6 seizures in 2018, then reports a seizure at work in January 2019 and March or April 2019. He missed his medication in January but there was no clear trigger for last seizure in March/April 2019. He denies any clear myoclonic jerks, he notices occasional trembling of 2 fingers on his right hand during the day. He denies any gaps in time, olfactory/gustatory hallucinations, deja vu, rising epigastric sensation, focal numbness/tingling/weakness. He usually has a headache lasting 5-6 hours after a seizure and sleeps. He denies any significant regular headaches, dizziness, diplopia, dysarthria/dysphagia, neck pain, bowel/bladder dysfunction. He has occasional low back pain. Memory is good, he has noticed memory changes with his seizure medications, he usually writes things down. He lives with his 48 year old son. He does not drive.   Epilepsy Risk Factors:  His 2 paternal uncles had seizures. Otherwise he had a normal birth and early development.  There is no history of febrile convulsions, CNS infections such as meningitis/encephalitis, significant traumatic brain injury, neurosurgical procedures.  PAST MEDICAL HISTORY: Past Medical History:  Diagnosis Date  . Allergy   . Seizures ([place])     MEDICATIONS: Current Outpatient Medications on File Prior to Visit  Medication  Sig Dispense Refill  . divalproex (DEPAKOTE ER) 250 MG 24 hr tablet Take 1 tablet every night 90 tablet 3  . lamoTRIgine (LAMICTAL) 200 MG tablet Take 2 tablets twice a day 360 tablet 3  . levETIRAcetam (KEPPRA) 500 MG tablet Take 3  tablets twice a day 540 tablet 3  . Multiple Vitamin (MULTIVITAMIN) tablet Take by mouth.     No current facility-administered medications on file prior to visit.     ALLERGIES: Allergies  Allergen Reactions  . Codeine Nausea And Vomiting  . Chlorpheniramine-Pseudoeph   . Diphenhydramine   . Doxylamine     FAMILY HISTORY: Family History  Problem Relation Age of Onset  . Cancer Mother   . Hypertension Mother   . Cancer Maternal Grandmother   . Hyperlipidemia Maternal Grandmother     SOCIAL HISTORY: Social History   Socioeconomic History  . Marital status: Divorced    Spouse name: Not on file  . Number of children: Not on file  . Years of education: Not on file  . Highest education level: Not on file  Occupational History  . Not on file  Social Needs  . Financial resource strain: Not on file  . Food insecurity    Worry: Not on file    Inability: Not on file  . Transportation needs    Medical: Not on file    Non-medical: Not on file  Tobacco Use  . Smoking status: Never Smoker  . Smokeless tobacco: Never Used  Substance and Sexual Activity  . Alcohol use: Not Currently    Alcohol/week: 0.0 standard drinks  . Drug use: Never  . Sexual activity: Not on file  Lifestyle  . Physical activity    Days per week: Not on file    Minutes per session: Not on file  . Stress: Not on file  Relationships  . Social Musicianconnections    Talks on phone: Not on file    Gets together: Not on file    Attends religious service: Not on file    Active member of club or organization: Not on file    Attends meetings of clubs or organizations: Not on file    Relationship status: Not on file  . Intimate partner violence    Fear of current or ex partner: Not on file    Emotionally abused: Not on file    Physically abused: Not on file    Forced sexual activity: Not on file  Other Topics Concern  . Not on file  Social History Narrative  . Not on file    REVIEW OF  SYSTEMS: Constitutional: No fevers, chills, or sweats, no generalized fatigue, change in appetite Eyes: No visual changes, double vision, eye pain Ear, nose and throat: No hearing loss, ear pain, nasal congestion, sore throat Cardiovascular: No chest pain, palpitations Respiratory:  No shortness of breath at rest or with exertion, wheezes GastrointestinaI: No nausea, vomiting, diarrhea, abdominal pain, fecal incontinence Genitourinary:  No dysuria, urinary retention or frequency Musculoskeletal:  No neck pain, back pain Integumentary: No rash, pruritus, skin lesions Neurological: as above Psychiatric: No depression, insomnia, anxiety Endocrine: No palpitations, fatigue, diaphoresis, mood swings, change in appetite, change in weight, increased thirst Hematologic/Lymphatic:  No anemia, purpura, petechiae. Allergic/Immunologic: no itchy/runny eyes, nasal congestion, recent allergic reactions, rashes  PHYSICAL EXAM: Vitals:   02/01/19 1019  BP: 127/81  Pulse: 79  SpO2: 98%   General: No acute distress Head:  Normocephalic/atraumatic Skin/Extremities: No rash, no edema Neurological Exam: alert  and oriented to person, place, and time. No aphasia or dysarthria. Fund of knowledge is appropriate.  Recent and remote memory are intact.  Attention and concentration are normal.    Able to name objects and repeat phrases. Cranial nerves: Pupils equal, round, reactive to light. Extraocular movements intact with no nystagmus. Visual fields full. Facial sensation intact. No facial asymmetry. Motor: Bulk and tone normal, muscle strength 5/5 throughout with no pronator drift. Finger to nose testing intact with mild endpoint tremor bilaterally.  Gait narrow-based and steady, able to tandem walk adequately.  Romberg negative.   IMPRESSION: This is a pleasant 33 yo RH man with a history of primary generalized epilepsy. Prior EEG showed generalized 3-4 Hz spike and polyspikes with frontal predominance. He has  had a good response to addition of low dose Depakote ER 250mg  qhs to Levetiracetam 1500mg  BID and Lamotrigine 400mg  BID. No seizures since August 2019. We have agreed to continue on current regimen. He reports daytime drowsiness and snoring, home sleep study will be ordered to assess for sleep apnea. He does not drive. Follow-up in 8 months and knows to call for any changes.   Thank you for allowing me to participate in his care.  Please do not hesitate to call for any questions or concerns.   Patrcia Dolly, M.D.

## 2019-02-01 NOTE — Patient Instructions (Signed)
1. Schedule home sleep study  2. Continue all your medications  3. Follow-up in 8 months, call for any changes  Seizure Precautions: 1. If medication has been prescribed for you to prevent seizures, take it exactly as directed.  Do not stop taking the medicine without talking to your doctor first, even if you have not had a seizure in a long time.   2. Avoid activities in which a seizure would cause danger to yourself or to others.  Don't operate dangerous machinery, swim alone, or climb in high or dangerous places, such as on ladders, roofs, or girders.  Do not drive unless your doctor says you may.  3. If you have any warning that you may have a seizure, lay down in a safe place where you can't hurt yourself.    4.  No driving for 6 months from last seizure, as per [facility].   Please refer to the following link on the [place] website for more information: http://www.epilepsyfoundation.org/answerplace/Social/driving/drivingu.cfm   5.  Maintain good sleep hygiene. Avoid alcohol.  6.  Contact your doctor if you have any problems that may be related to the medicine you are taking.  7.  Call 911 and bring the patient back to the ED if:        A.  The seizure lasts longer than 5 minutes.       B.  The patient doesn't awaken shortly after the seizure  C.  The patient has new problems such as difficulty seeing, speaking or moving  D.  The patient was injured during the seizure  E.  The patient has a temperature over 102 F (39C)  F.  The patient vomited and now is having trouble breathing

## 2019-05-01 ENCOUNTER — Other Ambulatory Visit: Payer: Self-pay

## 2019-05-01 DIAGNOSIS — Z20822 Contact with and (suspected) exposure to covid-19: Secondary | ICD-10-CM

## 2019-05-02 LAB — NOVEL CORONAVIRUS, NAA: SARS-CoV-2, NAA: NOT DETECTED

## 2019-09-03 ENCOUNTER — Ambulatory Visit: Payer: BC Managed Care – PPO | Admitting: Neurology

## 2020-02-17 ENCOUNTER — Other Ambulatory Visit: Payer: Self-pay | Admitting: Neurology

## 2020-03-13 ENCOUNTER — Other Ambulatory Visit: Payer: Self-pay | Admitting: Neurology

## 2020-04-04 ENCOUNTER — Other Ambulatory Visit: Payer: Self-pay | Admitting: Neurology

## 2020-05-21 ENCOUNTER — Other Ambulatory Visit: Payer: Self-pay | Admitting: Neurology

## 2020-06-19 ENCOUNTER — Encounter: Payer: Self-pay | Admitting: Neurology

## 2020-06-19 ENCOUNTER — Other Ambulatory Visit: Payer: Self-pay

## 2020-06-19 ENCOUNTER — Telehealth (INDEPENDENT_AMBULATORY_CARE_PROVIDER_SITE_OTHER): Payer: BC Managed Care – PPO | Admitting: Neurology

## 2020-06-19 VITALS — Ht 70.0 in | Wt 225.0 lb

## 2020-06-19 DIAGNOSIS — G40319 Generalized idiopathic epilepsy and epileptic syndromes, intractable, without status epilepticus: Secondary | ICD-10-CM

## 2020-06-19 DIAGNOSIS — R400 Somnolence: Secondary | ICD-10-CM | POA: Diagnosis not present

## 2020-06-19 MED ORDER — DIVALPROEX SODIUM ER 250 MG PO TB24: 250.0000 mg | ORAL_TABLET | Freq: Every day | ORAL | 3 refills | Status: DC

## 2020-06-19 MED ORDER — LAMOTRIGINE 200 MG PO TABS: ORAL_TABLET | ORAL | 3 refills | Status: DC

## 2020-06-19 MED ORDER — LEVETIRACETAM 500 MG PO TABS: ORAL_TABLET | ORAL | 3 refills | Status: DC

## 2020-06-19 NOTE — Progress Notes (Signed)
Telephone (Audio) Visit The purpose of this telephone visit is to provide medical care while limiting exposure to the novel coronavirus.    Consent was obtained for telephone visit:  Yes.   Answered questions that patient had about telehealth interaction:  Yes.   I discussed the limitations, risks, security and privacy concerns of performing an evaluation and management service by telephone. I also discussed with the patient that there may be a patient responsible charge related to this service. The patient expressed understanding and agreed to proceed.  Pt location: Home Physician Location: office Name of referring provider:  No ref. provider found I connected with .Lucas Anderson at patients initiation/request on 06/19/2020 at  1:30 PM EST by telephone and verified that I am speaking with the correct person using two identifiers.  Pt MRN:  [id number] Pt DOB:  13-Nov-1985   History of Present Illness:  The patient had a telephone visit on 06/19/2020. Unable to connect via video due to technical difficulties. He was last seen in the neurology clinic over a year ago. His mother is present to provide additional information. He had been doing well seizure-free since 2019, until he had a different type of seizure a couple of weeks ago. He states it was much,much different than usual. He recalls taking his night medications and sitting on the couch looking at the clock and seeing it was 6:37pm. He recalls going to the fridge and seeing it was 6:38pm. He did not think anything happened, however his son Lucas Anderson witnessed a seizure while he was sitting on the couch. Ryan told the patient's mother that he was on the couch and started to speak, then had a scared expression on his face. He made a groaning sound, different from his typical yelling with his prior seizures, then started shaking with his feet repeatedly hitting the coffee table. He remained sitting and did not fall off the couch. He then got up and  walked to the kitchen going about his business, looking through the trash, going to the fridge and drinking out of the milk carton. When his mother arrived, he was foggy and did not think he had a seizure, answering questions appropriately. He had not been sleeping well recently due to shoulder pain. No missed medications or alcohol use. He continues on Lamotrigine 400mg  BID, Levetiracetam 1500mg  BID, and Depakote ER 250mg  qhs without significant side effects. He continues to report feeling sluggish during the day, he feels he gets 7 hours of sleep but has daytime drowsiness and snores at night. We had previously discussed doing a sleep study but it was not done. He feels the tremors in his hands are less, usually more noticeable in times of stress. He denies any headaches, dizziness, focal numbness/tingling/weakness, no falls.    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 Seizures started in 2009, he has an average of 3-4 seizures a year, provoked by sleep deprivation and missed medication. Longest seizure-free interval was close to a year. He denies any clear warning, but one time at work last March 2019 he felt fuzzy then woke up on the floor with slight tongue bite. Family has reported he would stare and become unresponsive, then vocalize and fall to the ground, with convulsions lasting a few minutes. MRI brain in 38 reported as normal. EEG in January 2012 reported bilaterally synchronous bifrontally predominant spike and waves and polyspikes of 3-4  Hz frequency. He had irritability on higher doses of Keppra, currently on Keppra 1500mg  BID. He has been on escalating doses of Lamotrigine, last dose change in September 2018 to 400mg  BID for seizure in August 2018. He reports an increase in frequency of seizures, he had 6 seizures in 2018, then reports a seizure at work in January 2019 and  March or April 2019. He missed his medication in January but there was no clear trigger for last seizure in March/April 2019. He denies any clear myoclonic jerks, he notices occasional trembling of 2 fingers on his right hand during the day. He denies any gaps in time, olfactory/gustatory hallucinations, deja vu, rising epigastric sensation, focal numbness/tingling/weakness. He usually has a headache lasting 5-6 hours after a seizure and sleeps. He denies any significant regular headaches, dizziness, diplopia, dysarthria/dysphagia, neck pain, bowel/bladder dysfunction. He has occasional low back pain. Memory is good, he has noticed memory changes with his seizure medications, he usually writes things down. He lives with his 45 year old son. He does not drive.   Epilepsy Risk Factors:  His 2 paternal uncles had seizures. Otherwise he had a normal birth and early development.  There is no history of febrile convulsions, CNS infections such as meningitis/encephalitis, significant traumatic brain injury, neurosurgical procedures.     Current Outpatient Medications on File Prior to Visit  Medication Sig Dispense Refill  . divalproex (DEPAKOTE ER) 250 MG 24 hr tablet TAKE 1 TABLET BY MOUTH EVERY NIGHT 30 tablet 0  . lamoTRIgine (LAMICTAL) 200 MG tablet TAKE 2 TABLETS TWICE DAILY 360 tablet 1  . levETIRAcetam (KEPPRA) 500 MG tablet TAKE 3 TABLETS BY MOUTH TWICE DAILY 540 tablet 0  . Multiple Vitamin (MULTIVITAMIN) tablet Take by mouth.     No current facility-administered medications on file prior to visit.       Observations/Objective:   Vitals:   06/19/20 1246  Weight: 225 lb (102.1 kg)  Height: 5\' 10"  (1.778 m)   Exam limited due to nature of phone visit. Patient is awake, alert, able to answer questions without dysarthria or confusion.  Assessment and Plan:   This is a pleasant 35 yo RH man with a history of primary generalized epilepsy. Prior EEG showed generalized 3-4 Hz spike and  polyspikes with frontal predominance. He had a brief (<1 minute) convulsion a couple of weeks ago that may have occurred in the setting of interrupted sleep. Prior to this, his last seizure was more than 2 years ago. We discussed continuing on current doses of Lamotrigine 400mg  BID, Levetiracetam 1500mg  BID, and Depakote ER 250mg  qhs. Proceed with sleep study as previously planned. Safety labs with CBC, CMP, vitamin D level, will be ordered. He does not drive. Follow-up in 6 months, he knows to call for any changes.    Follow Up Instructions:   -I discussed the assessment and treatment plan with the patient. The patient was provided an opportunity to ask questions and all were answered. The patient agreed with the plan and demonstrated an understanding of the instructions.   The patient was advised to call back or seek an in-person evaluation if the symptoms worsen or if the condition fails to improve as anticipated.    Total Time spent in visit with the patient was:  25 minutes, of which 100% of the time was spent in counseling and/or coordinating care on the above.   Pt understands and agrees with the plan of care outlined.     06/21/20, MD

## 2020-06-19 NOTE — Addendum Note (Signed)
Addended by: Allean Found R on: 06/19/2020 02:57 PM   Modules accepted: Orders

## 2020-07-21 ENCOUNTER — Other Ambulatory Visit: Payer: Self-pay

## 2020-07-21 ENCOUNTER — Ambulatory Visit (HOSPITAL_BASED_OUTPATIENT_CLINIC_OR_DEPARTMENT_OTHER): Payer: BC Managed Care – PPO | Attending: Neurology | Admitting: Internal Medicine

## 2020-07-21 VITALS — Ht 70.0 in | Wt 225.0 lb

## 2020-07-21 DIAGNOSIS — G4733 Obstructive sleep apnea (adult) (pediatric): Secondary | ICD-10-CM

## 2020-07-21 DIAGNOSIS — R400 Somnolence: Secondary | ICD-10-CM

## 2020-07-21 DIAGNOSIS — G40319 Generalized idiopathic epilepsy and epileptic syndromes, intractable, without status epilepticus: Secondary | ICD-10-CM

## 2020-08-01 DIAGNOSIS — G40319 Generalized idiopathic epilepsy and epileptic syndromes, intractable, without status epilepticus: Secondary | ICD-10-CM | POA: Diagnosis not present

## 2020-08-01 NOTE — Procedures (Signed)
   Patient Name: Lucas Anderson, Lucas Anderson Date: 07/21/2020 Gender: Male D.O.B: [birth year] Age (years): 34 Referring Provider: Van Clines Height (inches): 70 Interpreting Physician: Jetty Duhamel MD, ABSM Weight (lbs): 225 RPSGT: Collier Sink BMI: 32 MRN: [id number] Neck Size: 16.50  CLINICAL INFORMATION Sleep Study Type: HST Indication for sleep study: OSA Epworth Sleepiness Score: 9  SLEEP STUDY TECHNIQUE A multi-channel overnight portable sleep study was performed. The channels recorded were: nasal airflow, thoracic respiratory movement, and oxygen saturation with a pulse oximetry. Snoring was also monitored.  MEDICATIONS Patient self administered medications include: none reported.  SLEEP ARCHITECTURE Patient was studied for 353.6 minutes. The sleep efficiency was 98.1 % and the patient was supine for 48.8%. The arousal index was 0.0 per hour.  RESPIRATORY PARAMETERS The overall AHI was 33.1 per hour, with a central apnea index of 0.0 per hour. The oxygen nadir was 80% during sleep.  CARDIAC DATA Mean heart rate during sleep was 84.9 bpm.  IMPRESSIONS - Severe obstructive sleep apnea occurred during this study (AHI = 33.1/h). - No significant central sleep apnea occurred during this study (CAI = 0.0/h). - Oxygen desaturation was noted during this study (Min O2 = 80%). Mean O2 saturation 93%. - Patient snored.  DIAGNOSIS - Obstructive Sleep Apnea (G47.33)  RECOMMENDATIONS - Suggest CPAP titration sleep study or autopap. Other options would be based on clinical judgment. - Be careful with alcohol, sedatives and other CNS depressants that may worsen sleep apnea and disrupt normal sleep architecture. - Sleep hygiene should be reviewed to assess factors that may improve sleep quality. - Weight management and regular exercise should be initiated or continued.  [Electronically signed] 08/01/2020 10:54 AM  Jetty Duhamel MD, ABSM Diplomate, American Board of Sleep  Medicine   NPI: [id number]                         Jetty Duhamel Diplomate, American Board of Sleep Medicine  ELECTRONICALLY SIGNED ON:  08/01/2020, 10:50 AM [facility] PH: [phone]   FX: [phone] ACCREDITED BY THE AMERICAN ACADEMY OF SLEEP MEDICINE

## 2020-08-04 ENCOUNTER — Telehealth: Payer: Self-pay

## 2020-08-04 NOTE — Telephone Encounter (Signed)
Pt called no answer left a voice mail for pt to call back  

## 2020-08-04 NOTE — Telephone Encounter (Signed)
-----   Message from Van Clines, MD sent at 08/04/2020 12:58 PM EST ----- Pls let him know the sleep study showed severe sleep apnea. Recommendation is starting CPAP. If he is agreeable, pls send order for CPAP titration, also pls let him know he will need to start seeing a sleep specialist, pls send referral to Pulmonary, Dx; severe sleep apnea. Thanks

## 2020-08-06 NOTE — Progress Notes (Signed)
Results printed for pt

## 2020-08-06 NOTE — Progress Notes (Signed)
Results printed for pt and I highlighted where impressions stated severe

## 2020-08-21 ENCOUNTER — Other Ambulatory Visit: Payer: Self-pay

## 2020-08-21 DIAGNOSIS — G4730 Sleep apnea, unspecified: Secondary | ICD-10-CM

## 2020-09-07 ENCOUNTER — Telehealth: Payer: Self-pay | Admitting: Neurology

## 2020-09-07 NOTE — Telephone Encounter (Signed)
Spoke to pt mom informed her that labs where ordered the day of his appointment, she stated that at the appointment they where informed that he had to get a PCP to have the labs done. She was again told I wasn't sure what was told at the appointment but the labs are active and that he can come to the office to check in for labs and then go to 211 to have the lab work done, she is asking if we can add LFT to the labs as well? Because has not had them checked in a while and with his medications he is on she thinks that they need to be checked as well.

## 2020-09-07 NOTE — Telephone Encounter (Signed)
I'm not sure where she got the information, but we ordered the labs, and LFTs are part of CMP. Wanted to get CBC, CMP, vitamin D level. Thanks

## 2020-09-07 NOTE — Telephone Encounter (Signed)
Patient mother has some questions about Blood work and why we could not order the blood work. Why does the PCP have to order it

## 2020-09-08 NOTE — Telephone Encounter (Signed)
Pt called no answer left a voice mail to call the office  

## 2020-09-10 NOTE — Telephone Encounter (Signed)
Spoke with pt and informed him that he can come by the office when he can to have his labs done

## 2020-09-23 ENCOUNTER — Other Ambulatory Visit (INDEPENDENT_AMBULATORY_CARE_PROVIDER_SITE_OTHER): Payer: BC Managed Care – PPO

## 2020-09-23 ENCOUNTER — Other Ambulatory Visit: Payer: Self-pay

## 2020-09-23 DIAGNOSIS — G40319 Generalized idiopathic epilepsy and epileptic syndromes, intractable, without status epilepticus: Secondary | ICD-10-CM

## 2020-09-23 DIAGNOSIS — R400 Somnolence: Secondary | ICD-10-CM

## 2020-09-23 LAB — CBC
HCT: 46.6 % (ref 39.0–52.0)
Hemoglobin: 15.6 g/dL (ref 13.0–17.0)
MCHC: 33.5 g/dL (ref 30.0–36.0)
MCV: 89.5 fl (ref 78.0–100.0)
Platelets: 214 10*3/uL (ref 150.0–400.0)
RBC: 5.21 Mil/uL (ref 4.22–5.81)
RDW: 13 % (ref 11.5–15.5)
WBC: 6 10*3/uL (ref 4.0–10.5)

## 2020-09-23 LAB — COMPREHENSIVE METABOLIC PANEL
ALT: 28 U/L (ref 0–53)
AST: 19 U/L (ref 0–37)
Albumin: 4.2 g/dL (ref 3.5–5.2)
Alkaline Phosphatase: 69 U/L (ref 39–117)
BUN: 15 mg/dL (ref 6–23)
CO2: 28 mEq/L (ref 19–32)
Calcium: 9 mg/dL (ref 8.4–10.5)
Chloride: 104 mEq/L (ref 96–112)
Creatinine, Ser: 0.96 mg/dL (ref 0.40–1.50)
GFR: 103.09 mL/min (ref >60.00)
Glucose, Bld: 87 mg/dL (ref 70–99)
Potassium: 4.2 mEq/L (ref 3.5–5.1)
Sodium: 140 mEq/L (ref 135–145)
Total Bilirubin: 0.5 mg/dL (ref 0.2–1.2)
Total Protein: 7 g/dL (ref 6.0–8.3)

## 2020-09-23 LAB — VITAMIN D 25 HYDROXY (VIT D DEFICIENCY, FRACTURES): VITD: 17.94 ng/mL — ABNORMAL LOW (ref 30.00–100.00)

## 2020-10-07 ENCOUNTER — Encounter: Payer: Self-pay | Admitting: Pulmonary Disease

## 2020-10-07 ENCOUNTER — Other Ambulatory Visit: Payer: Self-pay

## 2020-10-07 ENCOUNTER — Ambulatory Visit: Payer: BC Managed Care – PPO | Admitting: Pulmonary Disease

## 2020-10-07 VITALS — BP 134/90 | HR 89 | Temp 97.8°F | Ht 69.0 in | Wt 228.0 lb

## 2020-10-07 DIAGNOSIS — G4733 Obstructive sleep apnea (adult) (pediatric): Secondary | ICD-10-CM

## 2020-10-07 NOTE — Patient Instructions (Signed)
We discussed that you have severe OSA , worse when Your back. Prescription for auto CPAP 5 to 15 cm with nasal cradle mask will be sent to DME

## 2020-10-07 NOTE — Progress Notes (Signed)
Subjective:    Patient ID: Lucas Anderson, male    DOB: Nov 06, 1985, 35 y.o.   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 He has seizure disorder since 2009 which has been better controlled over the last 2 years on his current regimen of Keppra, Lamictal and valproate.  His last seizure was January 2022. Epworth Sleepiness Scale is 9.  He reports some sleepiness when he is not active, especially while watching TV or as a passenger in a car. Bedtime is between 1030 and 11:30 PM, sleep latency is minimal up to 15 minutes, he sleeps on his right side with 1 pillow, reports 2-3 nocturnal awakenings including nocturia and is out of bed by 6:30 AM very often after his son goes to school he will snooze again for an hour up to 8:30 AM before finally getting out of bed, reports dryness of mouth denies headaches.  Loud snoring has been noted by family members. He admits to 20 pound weight gain over the last 3 years There is no history suggestive of cataplexy, sleep paralysis or parasomnias   Significant tests/ events reviewed  HST 07/21/20 AHI 33/hour, lowest desaturation 80%, supine AHI 55/hour  Past Medical History:  Diagnosis Date  . Allergy   . Seizures (HCC)    History reviewed. No pertinent surgical history.  Allergies  Allergen Reactions  . Codeine Nausea And Vomiting  . Chlorpheniramine-Pseudoeph   . Diphenhydramine   . Doxylamine     Social History   Socioeconomic History  . Marital status: Divorced    Spouse name: Not on file  . Number of children: Not on file  . Years of education: Not on file  . Highest education level: Not on file  Occupational History  . Not on file  Tobacco Use  . Smoking status: Never Smoker  . Smokeless tobacco: Never Used  Vaping Use  . Vaping Use: Never used  Substance and Sexual Activity  . Alcohol use: Not Currently    Alcohol/week: 0.0 standard drinks  . Drug use:  Never  . Sexual activity: Not on file  Other Topics Concern  . Not on file  Social History Narrative   Right handed      Law school      Divorced. Son lives with him   Drinks no caffeine   3 story apartment building   Social Determinants of Health   Financial Resource Strain: Not on file  Food Insecurity: Not on file  Transportation Needs: Not on file  Physical Activity: Not on file  Stress: Not on file  Social Connections: Not on file  Intimate Partner Violence: Not on file    Family History  Problem Relation Age of Onset  . Cancer Mother   . Hypertension Mother   . Cancer Maternal Grandmother   . Hyperlipidemia Maternal Grandmother      Review of Systems  Complains of acid heartburn. Last seizure January 2022  Constitutional: negative for anorexia, fevers and sweats  Eyes: negative for irritation, redness and visual disturbance  Ears, nose, mouth, throat, and face: negative for earaches, epistaxis, nasal congestion and sore throat  Respiratory: negative for cough, dyspnea on exertion, sputum and wheezing  Cardiovascular: negative for chest pain, dyspnea, lower extremity edema, orthopnea, palpitations and syncope  Gastrointestinal: negative for abdominal pain, constipation, diarrhea, melena, nausea and vomiting  Genitourinary:negative for dysuria, frequency and hematuria  Hematologic/lymphatic: negative for bleeding, easy bruising and lymphadenopathy  Musculoskeletal:negative for arthralgias, muscle weakness and stiff joints  Neurological: negative for coordination problems, gait problems, headaches and weakness  Endocrine: negative for diabetic symptoms including polydipsia, polyuria and weight loss     Objective:   Physical Exam  Gen. Pleasant, obese, in no distress, normal affect ENT - no pallor,icterus, no post nasal drip, class 2 airway Neck: No JVD, no thyromegaly, no carotid bruits Lungs: no use of accessory muscles, no dullness to percussion, decreased  without rales or rhonchi  Cardiovascular: Rhythm regular, heart sounds  normal, no murmurs or gallops, no peripheral edema Abdomen: soft and non-tender, no hepatosplenomegaly, BS normal. Musculoskeletal: No deformities, no cyanosis or clubbing Neuro:  alert, non focal, no tremors       Assessment & Plan:

## 2020-10-08 NOTE — Assessment & Plan Note (Signed)
We discussed details of his home sleep study, this shows severe OSA especially during supine sleep.  I do not think positional therapy by itself would be helpful.  He has an AHI of about 11/hour in the nonsupine position.  Also it is possible that his nonrefreshing sleep and hypoxia could worsen his seizure disorder. He was open to CPAP therapy, I do not think oral appliance will be optimal here.  We will initiate auto CPAP 5 to 15 cm with nasal cradle mask.  We will review download in the future visit and optimize settings as needed  Weight loss encouraged, compliance with goal of at least 4-6 hrs every night is the expectation. Advised against medications with sedative side effects Cautioned against driving when sleepy - understanding that sleepiness will vary on a day to day basis

## 2020-10-13 ENCOUNTER — Other Ambulatory Visit: Payer: Self-pay | Admitting: Neurology

## 2020-10-13 MED ORDER — VITAMIN D (ERGOCALCIFEROL) 50000 UNITS PO CAPS: 1.0000 | ORAL_CAPSULE | ORAL | 0 refills | Status: DC

## 2020-10-14 ENCOUNTER — Telehealth: Payer: Self-pay

## 2020-10-14 NOTE — Telephone Encounter (Signed)
Pt called to go over lab results no answer left a voice mail to call the office back  

## 2020-10-14 NOTE — Telephone Encounter (Signed)
-----   Message from Van Clines, MD sent at 10/13/2020  4:27 PM EDT ----- Pls let him know the vitamin D level was low, recommend taking 50,000 IU once a week for 8 weeks, Rx sent to pharmacy. After he finishes this, he should take a daily over the counter calcium with vitamin D supplement. Thanks

## 2020-12-17 ENCOUNTER — Telehealth: Payer: Self-pay | Admitting: Pulmonary Disease

## 2020-12-17 NOTE — Telephone Encounter (Signed)
Call made to patient, confirmed DOB. Patient states the supplier attempted to call them back to set up appt for his cpap machine however when he calls them back he cannot get anyone on the phone.   Call made to [facility], they states they will be contacting the patient in the next 24-48 hours.   Call made back to patient to make aware. Voiced understanding.   Nothing further needed at this time.

## 2020-12-25 ENCOUNTER — Ambulatory Visit: Payer: BC Managed Care – PPO | Admitting: Neurology

## 2020-12-31 ENCOUNTER — Telehealth: Payer: Self-pay | Admitting: Pulmonary Disease

## 2020-12-31 NOTE — Telephone Encounter (Signed)
Received fax from [employer] that CPAP was started  Pt needs appt from 01/25/21-03/25/21  Called and there was no answer- LMTCB

## 2021-02-22 ENCOUNTER — Ambulatory Visit: Payer: BC Managed Care – PPO | Admitting: Pulmonary Disease

## 2021-02-24 ENCOUNTER — Ambulatory Visit (INDEPENDENT_AMBULATORY_CARE_PROVIDER_SITE_OTHER): Payer: BC Managed Care – PPO | Admitting: Pulmonary Disease

## 2021-02-24 ENCOUNTER — Encounter: Payer: Self-pay | Admitting: Pulmonary Disease

## 2021-02-24 ENCOUNTER — Other Ambulatory Visit: Payer: Self-pay

## 2021-02-24 DIAGNOSIS — J301 Allergic rhinitis due to pollen: Secondary | ICD-10-CM

## 2021-02-24 DIAGNOSIS — G4733 Obstructive sleep apnea (adult) (pediatric): Secondary | ICD-10-CM

## 2021-02-24 NOTE — Assessment & Plan Note (Signed)
We discussed rebound effect if he stops using nasal decongestants. Suggest nasal steroid such as Flonase or Nasonex instead

## 2021-02-24 NOTE — Patient Instructions (Signed)
Change CPAP settings to 10-15 cm Trial of flonase each nare at bedtime during fall Compliance of at least 6h every night is expected

## 2021-02-24 NOTE — Assessment & Plan Note (Signed)
CPAP download was reviewed which shows compliance about 4 hours on average, residual AHI of 4/hour on average but uncertain nights increasing to 10/hour, moderate leak on auto settings 5 to 15 cm.  We will increase auto CPAP settings to 10 to 15 cm to correct residual events.  I showed him AirFit F30 fullface mask which may be better with his glasses and provide a better seal. Compliance of 6 hours was encouraged  Weight loss encouraged, compliance with goal of at least 4-6 hrs every night is the expectation. Advised against medications with sedative side effects Cautioned against driving when sleepy - understanding that sleepiness will vary on a day to day basis

## 2021-02-24 NOTE — Progress Notes (Signed)
   Subjective:    Patient ID: Lucas Anderson, male    DOB: 1985-10-30, 35 y.o.   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  PMH - seizure disorder since 2009 which has been better controlled over the last 2 years on his current regimen of Keppra, Lamictal and valproate.  His last seizure was January 2022  We will set him up with auto CPAP 5 to 15 cm and he has been using this for more than a month.  He feels better rested, has more energy in the daytime. He was given a fullface mask, feels like an AirFit F20.  He was initially very compliant and over the last 1 to 2 weeks developed nasal stuffiness for which she has been using OTC decongestant, compliance has decreased to 3 to 4 hours per night. Pressure is okay, denies daytime somnolence and fatigue    Significant tests/ events reviewed  HST 07/21/20 AHI 33/hour, lowest desaturation 80%, supine AHI 55/hour   Review of Systems neg for any significant sore throat, dysphagia, itching, sneezing, nasal congestion or excess/ purulent secretions, fever, chills, sweats, unintended wt loss, pleuritic or exertional cp, hempoptysis, orthopnea pnd or change in chronic leg swelling. Also denies presyncope, palpitations, heartburn, abdominal pain, nausea, vomiting, diarrhea or change in bowel or urinary habits, dysuria,hematuria, rash, arthralgias, visual complaints, headache, numbness weakness or ataxia.     Objective:   Physical Exam  Gen. Pleasant, well-nourished, in no distress ENT - no thrush, no pallor/icterus,no post nasal drip, septum deviated to the left Neck: No JVD, no thyromegaly, no carotid bruits Lungs: no use of accessory muscles, no dullness to percussion, clear without rales or rhonchi  Cardiovascular: Rhythm regular, heart sounds  normal, no murmurs or gallops, no peripheral edema Musculoskeletal: No deformities, no cyanosis or clubbing        Assessment & Plan:

## 2021-06-09 ENCOUNTER — Other Ambulatory Visit: Payer: Self-pay

## 2021-06-09 ENCOUNTER — Ambulatory Visit: Payer: BC Managed Care – PPO | Admitting: Neurology

## 2021-06-09 ENCOUNTER — Encounter: Payer: Self-pay | Admitting: Neurology

## 2021-06-09 VITALS — BP 116/76 | HR 87 | Ht 69.0 in | Wt 245.0 lb

## 2021-06-09 DIAGNOSIS — G40319 Generalized idiopathic epilepsy and epileptic syndromes, intractable, without status epilepticus: Secondary | ICD-10-CM

## 2021-06-09 MED ORDER — LEVETIRACETAM 500 MG PO TABS: ORAL_TABLET | ORAL | 3 refills | Status: DC

## 2021-06-09 MED ORDER — DIVALPROEX SODIUM ER 250 MG PO TB24: 250.0000 mg | ORAL_TABLET | Freq: Every day | ORAL | 3 refills | Status: DC

## 2021-06-09 MED ORDER — LAMOTRIGINE 200 MG PO TABS: ORAL_TABLET | ORAL | 3 refills | Status: DC

## 2021-06-09 NOTE — Progress Notes (Signed)
NEUROLOGY FOLLOW UP OFFICE NOTE  Lucas Anderson [id number] 1985-06-08  HISTORY OF PRESENT ILLNESS: I had the pleasure of seeing Lucas Anderson in follow-up in the neurology clinic on 06/09/2021.  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 He is alone in the office today. Records and images were personally reviewed where available.  Since his last visit, he had a sleep study showing severe OSA. He has seen Dr. Vassie Loll and now has a CPAP, feeling much better. His vitamin D level was also low in 09/2020 (17.94), he is currently on vitamin D supplements. He reports doing well in the past year, seizure-free since January 2022. He is on Lamotrigine 400mg  BID, Levetiracetam 1500mg  BID, and Depakote ER 250mg  qhs without significant side effects. He has occasional hand tremors that do not affect daily activities. He lives with his son. He denies any staring/unresponsive episodes, gaps in time, olfactory/gustatory hallucinations, focal numbness/tingling/weakness, myoclonic jerks. No headaches, dizziness, diplopia, no falls. He gets around 7 hours of refreshing sleep. Mood is fine.  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 Seizures started in 2009, he has an average of 3-4 seizures a year, provoked by sleep deprivation and missed medication. Longest seizure-free interval was close to a year. He denies any clear warning, but one time at work last March 2019 he felt fuzzy then woke up on the floor with slight tongue bite. Family has reported he would stare and become unresponsive, then vocalize and fall to the ground, with convulsions lasting a few minutes. MRI brain in 38 reported as normal. EEG in January 2012 reported bilaterally synchronous bifrontally predominant spike and waves and polyspikes of 3-4 Hz frequency. He had irritability on higher doses of Keppra,  currently on Keppra 1500mg  BID. He has been on escalating doses of Lamotrigine, last dose change in September 2018 to 400mg  BID for seizure in August 2018. He reports an increase in frequency of seizures, he had 6 seizures in 2018, then reports a seizure at work in January 2019 and March or April 2019. He missed his medication in January but there was no clear trigger for last seizure in March/April 2019. He denies any clear myoclonic jerks, he notices occasional trembling of 2 fingers on his right hand during the day. He denies any gaps in time, olfactory/gustatory hallucinations, deja vu, rising epigastric sensation, focal numbness/tingling/weakness. He usually has a headache lasting 5-6 hours after a seizure and sleeps. He denies any significant regular headaches, dizziness, diplopia, dysarthria/dysphagia, neck pain, bowel/bladder dysfunction. He has occasional low back pain. Memory is good, he has noticed memory changes with his seizure medications, he usually writes things down. He lives with his 62 year old son. He does not drive.    Epilepsy Risk Factors:  His 2 paternal uncles had seizures. Otherwise he had a normal birth and early development.  There is no history of febrile convulsions, CNS infections such as meningitis/encephalitis, significant traumatic brain injury, neurosurgical procedures.   PAST MEDICAL HISTORY: Past Medical History:  Diagnosis Date   Allergy    Seizures (HCC)     MEDICATIONS: Current Outpatient Medications on File Prior to Visit  Medication Sig Dispense Refill   cholecalciferol (VITAMIN D3) 25 MCG (1000 UNIT) tablet Take 1,000 Units by mouth daily.     divalproex (DEPAKOTE ER) 250 MG 24 hr tablet Take 1 tablet (250 mg total) by mouth at bedtime.  90 tablet 3   lamoTRIgine (LAMICTAL) 200 MG tablet TAKE 2 TABLETS TWICE DAILY 360 tablet 3   levETIRAcetam (KEPPRA) 500 MG tablet TAKE 3 TABLETS BY MOUTH TWICE DAILY 540 tablet 3   Multiple Vitamin (MULTIVITAMIN) tablet  Take by mouth.     No current facility-administered medications on file prior to visit.    ALLERGIES: Allergies  Allergen Reactions   Codeine Nausea And Vomiting   Chlorpheniramine-Pseudoeph    Diphenhydramine    Doxylamine     FAMILY HISTORY: Family History  Problem Relation Age of Onset   Cancer Mother    Hypertension Mother    Cancer Maternal Grandmother    Hyperlipidemia Maternal Grandmother     SOCIAL HISTORY: Social History   Socioeconomic History   Marital status: Divorced    Spouse name: Not on file   Number of children: Not on file   Years of education: Not on file   Highest education level: Not on file  Occupational History   Not on file  Tobacco Use   Smoking status: Never   Smokeless tobacco: Never  Vaping Use   Vaping Use: Never used  Substance and Sexual Activity   Alcohol use: Not Currently    Alcohol/week: 0.0 standard drinks   Drug use: Never   Sexual activity: Not on file  Other Topics Concern   Not on file  Social History Narrative   Right handed      Law school      Divorced. Son lives with him   Drinks no caffeine   3 story apartment building   Social Determinants of Health   Financial Resource Strain: Not on file  Food Insecurity: Not on file  Transportation Needs: Not on file  Physical Activity: Not on file  Stress: Not on file  Social Connections: Not on file  Intimate Partner Violence: Not on file     PHYSICAL EXAM: Vitals:   06/09/21 0821  BP: 116/76  Pulse: 87  SpO2: 99%   General: No acute distress Head:  Normocephalic/atraumatic Skin/Extremities: No rash, no edema Neurological Exam: alert and awake. No aphasia or dysarthria. Fund of knowledge is appropriate.  Attention and concentration are normal.   Cranial nerves: Pupils equal, round. Extraocular movements intact with no nystagmus. Visual fields full.  No facial asymmetry.  Motor: Bulk and tone normal, muscle strength 5/5 throughout with no pronator drift.    Finger to nose testing intact.  Gait narrow-based and steady, able to tandem walk adequately.  Romberg negative.   IMPRESSION: This is a pleasant 36 yo RH man with a history of primary generalized epilepsy. Prior EEG showed generalized 3-4 Hz spike and polyspikes with frontal predominance. He denies any seizures since January 2022, continue Lamotrigine 400mg  BID, Levetiracetam 1500mg  BID, and Depakote ER 250mg  qhs. He is aware of [place] driving laws to stop driving after a seizure until 6 months seizure-free. Follow-up in 1 year, call for any changes.   Thank you for allowing me to participate in his care.  Please do not hesitate to call for any questions or concerns.    , M.D.   [facility]

## 2021-06-09 NOTE — Patient Instructions (Signed)
Always good to see you! Continue all your medications. Follow-up in 1 year, call for any changes.   Seizure Precautions: 1. If medication has been prescribed for you to prevent seizures, take it exactly as directed.  Do not stop taking the medicine without talking to your doctor first, even if you have not had a seizure in a long time.   2. Avoid activities in which a seizure would cause danger to yourself or to others.  Don't operate dangerous machinery, swim alone, or climb in high or dangerous places, such as on ladders, roofs, or girders.  Do not drive unless your doctor says you may.  3. If you have any warning that you may have a seizure, lay down in a safe place where you can't hurt yourself.    4.  No driving for 6 months from last seizure, as per [place] state law.   Please refer to the following link on the Epilepsy Foundation of America's website for more information: http://www.epilepsyfoundation.org/answerplace/Social/driving/drivingu.cfm   5.  Maintain good sleep hygiene. Avoid alcohol.  6.  Contact your doctor if you have any problems that may be related to the medicine you are taking.  7.  Call 911 and bring the patient back to the ED if:        A.  The seizure lasts longer than 5 minutes.       B.  The patient doesn't awaken shortly after the seizure  C.  The patient has new problems such as difficulty seeing, speaking or moving  D.  The patient was injured during the seizure  E.  The patient has a temperature over 102 F (39C)  F.  The patient vomited and now is having trouble breathing         

## 2021-06-11 ENCOUNTER — Other Ambulatory Visit: Payer: Self-pay | Admitting: Neurology

## 2022-01-05 ENCOUNTER — Telehealth: Payer: Self-pay | Admitting: Pulmonary Disease

## 2022-06-04 ENCOUNTER — Other Ambulatory Visit: Payer: Self-pay | Admitting: Neurology

## 2022-06-07 ENCOUNTER — Telehealth: Payer: Self-pay | Admitting: Neurology

## 2022-06-07 ENCOUNTER — Other Ambulatory Visit: Payer: Self-pay

## 2022-06-07 ENCOUNTER — Other Ambulatory Visit: Payer: Self-pay | Admitting: Neurology

## 2022-06-07 MED ORDER — DIVALPROEX SODIUM ER 250 MG PO TB24: 250.0000 mg | ORAL_TABLET | Freq: Every day | ORAL | 0 refills | Status: DC

## 2022-06-07 MED ORDER — LAMOTRIGINE 200 MG PO TABS: ORAL_TABLET | ORAL | 0 refills | Status: DC

## 2022-06-07 MED ORDER — LEVETIRACETAM 500 MG PO TABS: ORAL_TABLET | ORAL | 0 refills | Status: DC

## 2022-06-07 NOTE — Telephone Encounter (Signed)
Refill sent in for pt. 

## 2022-06-07 NOTE — Telephone Encounter (Signed)
1. Which medications need refilled? (List name and dosage, if known) levetiracetam, depakote, and lamotrigine  2. Which pharmacy/location is medication to be sent to? (include street and city if local pharmacy) [place]  Pt has now scheduled his year follow up for 12/09/22

## 2022-10-03 ENCOUNTER — Other Ambulatory Visit: Payer: Self-pay | Admitting: Neurology

## 2022-10-05 ENCOUNTER — Other Ambulatory Visit: Payer: Self-pay | Admitting: Neurology

## 2022-10-06 ENCOUNTER — Other Ambulatory Visit: Payer: Self-pay | Admitting: Neurology

## 2022-10-19 ENCOUNTER — Other Ambulatory Visit: Payer: Self-pay | Admitting: Neurology

## 2022-10-29 ENCOUNTER — Other Ambulatory Visit: Payer: Self-pay | Admitting: Neurology

## 2022-12-06 ENCOUNTER — Other Ambulatory Visit: Payer: Self-pay | Admitting: Neurology

## 2022-12-07 ENCOUNTER — Telehealth: Payer: Self-pay | Admitting: Neurology

## 2022-12-07 ENCOUNTER — Other Ambulatory Visit: Payer: Self-pay | Admitting: Neurology

## 2022-12-07 NOTE — Telephone Encounter (Signed)
Pt is out of keppra and needs a refill on the medication he states that he has appt on 12-09-22

## 2022-12-07 NOTE — Telephone Encounter (Signed)
Pt called refill was sent in

## 2022-12-09 ENCOUNTER — Ambulatory Visit: Payer: BC Managed Care – PPO | Admitting: Neurology

## 2022-12-09 ENCOUNTER — Encounter: Payer: Self-pay | Admitting: Neurology

## 2022-12-09 ENCOUNTER — Other Ambulatory Visit: Payer: BC Managed Care – PPO

## 2022-12-09 VITALS — BP 121/78 | HR 83 | Ht 69.0 in | Wt 261.8 lb

## 2022-12-09 DIAGNOSIS — Z79899 Other long term (current) drug therapy: Secondary | ICD-10-CM | POA: Diagnosis not present

## 2022-12-09 DIAGNOSIS — G40319 Generalized idiopathic epilepsy and epileptic syndromes, intractable, without status epilepticus: Secondary | ICD-10-CM | POA: Diagnosis not present

## 2022-12-09 MED ORDER — DIVALPROEX SODIUM ER 250 MG PO TB24: ORAL_TABLET | ORAL | 3 refills | Status: DC

## 2022-12-09 MED ORDER — LEVETIRACETAM 500 MG PO TABS: 1500.0000 mg | ORAL_TABLET | Freq: Two times a day (BID) | ORAL | 3 refills | Status: DC

## 2022-12-09 MED ORDER — LAMOTRIGINE 200 MG PO TABS: ORAL_TABLET | ORAL | 3 refills | Status: DC

## 2022-12-09 NOTE — Patient Instructions (Signed)
Always a pleasure to see you.  Have bloodwork done for CBC, CMP, vitamin D level  2. Continue Depakote ER 250mg  every night, Keppra (Levetiracetam) 500mg : take 3 tablets twice a day, and Lamotrigine 200mg : take 2 tablets twice a day  3. Follow-up in 1 year, call for any changes   Seizure Precautions: 1. If medication has been prescribed for you to prevent seizures, take it exactly as directed.  Do not stop taking the medicine without talking to your doctor first, even if you have not had a seizure in a long time.   2. Avoid activities in which a seizure would cause danger to yourself or to others.  Don't operate dangerous machinery, swim alone, or climb in high or dangerous places, such as on ladders, roofs, or girders.  Do not drive unless your doctor says you may.  3. If you have any warning that you may have a seizure, lay down in a safe place where you can't hurt yourself.    4.  No driving for 6 months from last seizure, as per [facility].   Please refer to the following link on the Epilepsy Foundation of America's website for more information: http://www.epilepsyfoundation.org/answerplace/Social/driving/drivingu.cfm   5.  Maintain good sleep hygiene. Avoid alcohol.  6.  Contact your doctor if you have any problems that may be related to the medicine you are taking.  7.  Call 911 and bring the patient back to the ED if:        A.  The seizure lasts longer than 5 minutes.       B.  The patient doesn't awaken shortly after the seizure  C.  The patient has new problems such as difficulty seeing, speaking or moving  D.  The patient was injured during the seizure  E.  The patient has a temperature over 102 F (39C)  F.  The patient vomited and now is having trouble breathing

## 2022-12-09 NOTE — Progress Notes (Signed)
NEUROLOGY FOLLOW UP OFFICE NOTE  Lucas Anderson [id number] 06-19-1985  HISTORY OF PRESENT ILLNESS: I had the pleasure of seeing Lucas Anderson in follow-up in the neurology clinic on 12/09/2022.  The patient was last seen over a year ago for primary generalized epilepsy. He is alone in the office today. Records and images were personally reviewed where available.  Since his last visit, he continues to do well seizure-free since January 2022 on Lamotrigine 400mg  twice a day, Levetiracetam 1500mg  BID, and Depakote ER 250mg  at bedtime without side effects. He denies any staring/unresponsive episodes, gaps in time, olfactory/gustatory hallucinations, focal numbness/tingling/weakness, myoclonic jerks. He has hand tremors  around once a week that do not affect daily activities. He denies any significant headaches, dizziness, vision changes, no falls. He gets 7 hours of sleep with a CPAP machine and has more energy. Mood is the same. He lives with his 86 year old son.    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 Seizures started in 2009, he has an average of 3-4 seizures a year, provoked by sleep deprivation and missed medication. Longest seizure-free interval was close to a year. He denies any clear warning, but one time at work last March 2019 he felt fuzzy then woke up on the floor with slight tongue bite. Family has reported he would stare and become unresponsive, then vocalize and fall to the ground, with convulsions lasting a few minutes. MRI brain in [place] reported as normal. EEG in January 2012 reported bilaterally synchronous bifrontally predominant spike and waves and polyspikes of 3-4 Hz frequency. He had irritability on higher doses of Keppra, currently on Keppra 1500mg  BID. He has been on escalating doses of Lamotrigine, last dose change in September 2018 to 400mg  BID for seizure in  August 2018. He reports an increase in frequency of seizures, he had 6 seizures in 2018, then reports a seizure at work in January 2019 and March or April 2019. He missed his medication in January but there was no clear trigger for last seizure in March/April 2019. He denies any clear myoclonic jerks, he notices occasional trembling of 2 fingers on his right hand during the day. He denies any gaps in time, olfactory/gustatory hallucinations, deja vu, rising epigastric sensation, focal numbness/tingling/weakness. He usually has a headache lasting 5-6 hours after a seizure and sleeps. He denies any significant regular headaches, dizziness, diplopia, dysarthria/dysphagia, neck pain, bowel/bladder dysfunction. He has occasional low back pain. Memory is good, he has noticed memory changes with his seizure medications, he usually writes things down. He lives with his 71 year old son. He does not drive.    Epilepsy Risk Factors:  His 2 paternal uncles had seizures. Otherwise he had a normal birth and early development.  There is no history of febrile convulsions, CNS infections such as meningitis/encephalitis, significant traumatic brain injury, neurosurgical procedures.   PAST MEDICAL HISTORY: Past Medical History:  Diagnosis Date   Allergy    Seizures (HCC)     MEDICATIONS: Current Outpatient Medications on File Prior to Visit  Medication Sig Dispense Refill   cholecalciferol (VITAMIN D3) 25 MCG (1000 UNIT) tablet Take 1,000 Units by mouth daily.     divalproex (DEPAKOTE ER) 250 MG 24 hr tablet TAKE 1 TABLET(250 MG) BY MOUTH AT BEDTIME 90 tablet 0   lamoTRIgine (LAMICTAL) 200 MG tablet TAKE 2 TABLETS BY MOUTH TWICE DAILY 360 tablet 0  levETIRAcetam (KEPPRA) 500 MG tablet TAKE 3 TABLETS BY MOUTH TWICE DAILY 180 tablet 0   Multiple Vitamin (MULTIVITAMIN) tablet Take by mouth.     No current facility-administered medications on file prior to visit.    ALLERGIES: Allergies  Allergen Reactions    Codeine Nausea And Vomiting   Chlorpheniramine-Pseudoeph    Diphenhydramine    Doxylamine     FAMILY HISTORY: Family History  Problem Relation Age of Onset   Cancer Mother    Hypertension Mother    Cancer Maternal Grandmother    Hyperlipidemia Maternal Grandmother     SOCIAL HISTORY: Social History   Socioeconomic History   Marital status: Divorced    Spouse name: Not on file   Number of children: Not on file   Years of education: Not on file   Highest education level: Not on file  Occupational History   Not on file  Tobacco Use   Smoking status: Never   Smokeless tobacco: Never  Vaping Use   Vaping Use: Never used  Substance and Sexual Activity   Alcohol use: Not Currently    Alcohol/week: 0.0 standard drinks of alcohol   Drug use: Never   Sexual activity: Not on file  Other Topics Concern   Not on file  Social History Narrative   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 No aphasia or dysarthria. Fund of knowledge is appropriate.  Attention and concentration are normal.   Cranial nerves: Pupils equal, round. Extraocular movements intact with no nystagmus. Visual fields full.  No facial asymmetry.  Motor: Bulk and tone normal, muscle strength 5/5 throughout with no pronator drift.   Finger to nose testing intact.  Gait narrow-based and steady, able to tandem walk adequately.  Romberg  negative.   IMPRESSION: This is a pleasant 37 yo RH man with a history of primary generalized epilepsy. Prior EEG showed generalized 3-4 Hz spike and polyspikes with frontal predominance. He has been seizure-free since January 2022 on Lamotrigine 400mg  BID, Levetiracetam 1500mg  BID, and Depakote ER 250mg  at bedtime, refills sent. He does not have a PCP, we will do safety labs, CBC, CMP, vitamin D level. We discussed Depakote and potential effects on bone metabolism, recommend bone density scan every 3 years. He is aware of [place] driving laws to stop driving after a seizure until 6 months seizure-free. Follow-up in 1 year, call for any changes.     Thank you for allowing me to participate in his care.  Please do not hesitate to call for any questions or concerns.    Patrcia Dolly, M.D.

## 2022-12-09 NOTE — Addendum Note (Signed)
Addended by: Dimas Chyle on: 12/09/2022 09:04 AM   Modules accepted: Orders

## 2022-12-16 ENCOUNTER — Other Ambulatory Visit (INDEPENDENT_AMBULATORY_CARE_PROVIDER_SITE_OTHER): Payer: BC Managed Care – PPO

## 2022-12-16 DIAGNOSIS — G40319 Generalized idiopathic epilepsy and epileptic syndromes, intractable, without status epilepticus: Secondary | ICD-10-CM | POA: Diagnosis not present

## 2022-12-16 DIAGNOSIS — Z79899 Other long term (current) drug therapy: Secondary | ICD-10-CM | POA: Diagnosis not present

## 2022-12-16 LAB — VITAMIN D 25 HYDROXY (VIT D DEFICIENCY, FRACTURES): VITD: 14.93 ng/mL — ABNORMAL LOW (ref 30.00–100.00)

## 2022-12-16 LAB — COMPREHENSIVE METABOLIC PANEL
ALT: 32 U/L (ref 0–53)
AST: 26 U/L (ref 0–37)
Albumin: 4.2 g/dL (ref 3.5–5.2)
Alkaline Phosphatase: 78 U/L (ref 39–117)
BUN: 16 mg/dL (ref 6–23)
CO2: 31 mEq/L (ref 19–32)
Calcium: 9.5 mg/dL (ref 8.4–10.5)
Chloride: 102 mEq/L (ref 96–112)
Creatinine, Ser: 1.11 mg/dL (ref 0.40–1.50)
GFR: 85.26 mL/min (ref >60.00)
Glucose, Bld: 107 mg/dL — ABNORMAL HIGH (ref 70–99)
Potassium: 4.4 mEq/L (ref 3.5–5.1)
Sodium: 141 mEq/L (ref 135–145)
Total Bilirubin: 0.5 mg/dL (ref 0.2–1.2)
Total Protein: 6.8 g/dL (ref 6.0–8.3)

## 2022-12-16 LAB — CBC
HCT: 46.4 % (ref 39.0–52.0)
Hemoglobin: 15 g/dL (ref 13.0–17.0)
MCHC: 32.3 g/dL (ref 30.0–36.0)
MCV: 90.4 fl (ref 78.0–100.0)
Platelets: 239 10*3/uL (ref 150.0–400.0)
RBC: 5.13 Mil/uL (ref 4.22–5.81)
RDW: 13.3 % (ref 11.5–15.5)
WBC: 6.4 10*3/uL (ref 4.0–10.5)

## 2022-12-19 MED ORDER — VITAMIN D (ERGOCALCIFEROL) 1.25 MG (50000 UNIT) PO CAPS: ORAL_CAPSULE | ORAL | 0 refills | Status: AC

## 2022-12-19 NOTE — Addendum Note (Signed)
Addended by: Van Clines on: 12/19/2022 10:18 AM   Modules accepted: Orders

## 2023-03-03 ENCOUNTER — Other Ambulatory Visit: Payer: Self-pay | Admitting: Neurology

## 2023-05-12 ENCOUNTER — Other Ambulatory Visit: Payer: Self-pay | Admitting: Neurology

## 2023-08-15 ENCOUNTER — Inpatient Hospital Stay: Admission: RE | Admit: 2023-08-15 | Payer: BC Managed Care – PPO | Source: Ambulatory Visit

## 2023-12-17 ENCOUNTER — Other Ambulatory Visit: Payer: Self-pay | Admitting: Neurology

## 2023-12-18 ENCOUNTER — Telehealth: Payer: Self-pay | Admitting: Neurology

## 2023-12-18 MED ORDER — LAMOTRIGINE 200 MG PO TABS: ORAL_TABLET | ORAL | 3 refills | Status: AC

## 2023-12-18 MED ORDER — LEVETIRACETAM 500 MG PO TABS: 1500.0000 mg | ORAL_TABLET | Freq: Two times a day (BID) | ORAL | 3 refills | Status: AC

## 2023-12-18 MED ORDER — DIVALPROEX SODIUM ER 250 MG PO TB24: ORAL_TABLET | ORAL | 3 refills | Status: AC

## 2023-12-18 NOTE — Telephone Encounter (Signed)
 I sent in refills for all 3 of his seizure meds, thanks

## 2023-12-18 NOTE — Telephone Encounter (Signed)
 1. Which medications need refilled? (List name and dosage, if known) divalproex  - pt has now scheduled a follow up appointment for 07/12/24  2. Which pharmacy/location is medication to be sent to? (include street and city if Insurance claims handler) [facility] [address]

## 2023-12-19 NOTE — Telephone Encounter (Signed)
 LMOVM for patient refills sent to the pharmacy. Please give us  a call if there are any issues pick up.

## 2024-04-17 ENCOUNTER — Encounter: Payer: Self-pay | Admitting: Neurology

## 2024-07-12 ENCOUNTER — Ambulatory Visit: Payer: Self-pay | Admitting: Neurology
